# Patient Record
Sex: Female | Born: 1950 | Race: White | Hispanic: No | Marital: Married | Smoking: Never smoker
Health system: Southern US, Community
[De-identification: ages and names within clinical notes are randomized; demographics above are authoritative.]

## PROBLEM LIST (undated history)

## (undated) DIAGNOSIS — M810 Age-related osteoporosis without current pathological fracture: Secondary | ICD-10-CM

## (undated) DIAGNOSIS — I499 Cardiac arrhythmia, unspecified: Secondary | ICD-10-CM

## (undated) DIAGNOSIS — N200 Calculus of kidney: Secondary | ICD-10-CM

## (undated) DIAGNOSIS — C4490 Unspecified malignant neoplasm of skin, unspecified: Secondary | ICD-10-CM

## (undated) DIAGNOSIS — R7989 Other specified abnormal findings of blood chemistry: Secondary | ICD-10-CM

## (undated) DIAGNOSIS — E079 Disorder of thyroid, unspecified: Secondary | ICD-10-CM

## (undated) HISTORY — DX: Age-related osteoporosis without current pathological fracture: M81.0

## (undated) HISTORY — PX: EYE SURGERY: SHX253

## (undated) HISTORY — DX: Disorder of thyroid, unspecified: E07.9

## (undated) HISTORY — DX: Cardiac arrhythmia, unspecified: I49.9

## (undated) HISTORY — DX: Unspecified malignant neoplasm of skin, unspecified: C44.90

## (undated) HISTORY — PX: HYSTEROSCOPY: SHX211

## (undated) HISTORY — DX: Other specified abnormal findings of blood chemistry: R79.89

## (undated) HISTORY — PX: DILATION AND CURETTAGE OF UTERUS: SHX78

## (undated) HISTORY — DX: Calculus of kidney: N20.0

---

## 2000-06-12 ENCOUNTER — Other Ambulatory Visit: Admission: RE | Admit: 2000-06-12 | Discharge: 2000-06-12 | Payer: Self-pay | Admitting: Gynecology

## 2000-10-08 ENCOUNTER — Encounter: Admission: RE | Admit: 2000-10-08 | Discharge: 2000-10-08 | Payer: Self-pay | Admitting: Internal Medicine

## 2000-10-08 ENCOUNTER — Encounter: Payer: Self-pay | Admitting: Internal Medicine

## 2001-01-31 ENCOUNTER — Encounter: Admission: RE | Admit: 2001-01-31 | Discharge: 2001-01-31 | Payer: Self-pay | Admitting: Internal Medicine

## 2001-01-31 ENCOUNTER — Encounter: Payer: Self-pay | Admitting: Internal Medicine

## 2001-06-19 ENCOUNTER — Other Ambulatory Visit: Admission: RE | Admit: 2001-06-19 | Discharge: 2001-06-19 | Payer: Self-pay | Admitting: Gynecology

## 2002-12-09 ENCOUNTER — Other Ambulatory Visit: Admission: RE | Admit: 2002-12-09 | Discharge: 2002-12-09 | Payer: Self-pay | Admitting: Gynecology

## 2003-01-01 ENCOUNTER — Encounter: Admission: RE | Admit: 2003-01-01 | Discharge: 2003-01-01 | Payer: Self-pay | Admitting: Internal Medicine

## 2003-01-01 ENCOUNTER — Encounter: Payer: Self-pay | Admitting: Internal Medicine

## 2003-12-22 ENCOUNTER — Other Ambulatory Visit: Admission: RE | Admit: 2003-12-22 | Discharge: 2003-12-22 | Payer: Self-pay | Admitting: Gynecology

## 2004-10-13 ENCOUNTER — Encounter: Admission: RE | Admit: 2004-10-13 | Discharge: 2004-10-13 | Payer: Self-pay | Admitting: Internal Medicine

## 2004-12-15 ENCOUNTER — Other Ambulatory Visit: Admission: RE | Admit: 2004-12-15 | Discharge: 2004-12-15 | Payer: Self-pay | Admitting: Gynecology

## 2005-12-01 ENCOUNTER — Other Ambulatory Visit: Admission: RE | Admit: 2005-12-01 | Discharge: 2005-12-01 | Payer: Self-pay | Admitting: Gynecology

## 2006-12-24 ENCOUNTER — Other Ambulatory Visit: Admission: RE | Admit: 2006-12-24 | Discharge: 2006-12-24 | Payer: Self-pay | Admitting: Gynecology

## 2007-12-09 ENCOUNTER — Encounter: Admission: RE | Admit: 2007-12-09 | Discharge: 2007-12-09 | Payer: Self-pay | Admitting: Internal Medicine

## 2007-12-16 ENCOUNTER — Other Ambulatory Visit: Admission: RE | Admit: 2007-12-16 | Discharge: 2007-12-16 | Payer: Self-pay | Admitting: Gynecology

## 2009-09-08 ENCOUNTER — Encounter: Admission: RE | Admit: 2009-09-08 | Discharge: 2009-09-08 | Payer: Self-pay | Admitting: Internal Medicine

## 2010-01-06 ENCOUNTER — Encounter: Admission: RE | Admit: 2010-01-06 | Discharge: 2010-01-06 | Payer: Self-pay | Admitting: Internal Medicine

## 2010-05-02 ENCOUNTER — Encounter: Admission: RE | Admit: 2010-05-02 | Discharge: 2010-05-02 | Payer: Self-pay | Admitting: Gastroenterology

## 2010-10-05 ENCOUNTER — Other Ambulatory Visit: Admission: RE | Admit: 2010-10-05 | Discharge: 2010-10-05 | Payer: Self-pay | Admitting: Radiology

## 2011-01-15 ENCOUNTER — Encounter: Payer: Self-pay | Admitting: Gastroenterology

## 2011-07-24 ENCOUNTER — Other Ambulatory Visit: Payer: Self-pay | Admitting: Gynecology

## 2011-07-24 ENCOUNTER — Ambulatory Visit (HOSPITAL_BASED_OUTPATIENT_CLINIC_OR_DEPARTMENT_OTHER)
Admission: RE | Admit: 2011-07-24 | Discharge: 2011-07-24 | Disposition: A | Discharge status: Home / Self Care | Payer: PRIVATE HEALTH INSURANCE | Source: Ambulatory Visit | Attending: Gynecology | Admitting: Gynecology

## 2011-07-24 DIAGNOSIS — Z01812 Encounter for preprocedural laboratory examination: Secondary | ICD-10-CM | POA: Insufficient documentation

## 2011-07-24 DIAGNOSIS — N950 Postmenopausal bleeding: Secondary | ICD-10-CM | POA: Insufficient documentation

## 2011-07-24 DIAGNOSIS — N840 Polyp of corpus uteri: Secondary | ICD-10-CM | POA: Insufficient documentation

## 2011-07-24 LAB — POCT HEMOGLOBIN-HEMACUE: Hemoglobin: 12.6 g/dL (ref 12.0–15.0)

## 2011-08-03 NOTE — Op Note (Signed)
  NAME:  [name], Denise                   ACCOUNT NO.:  [id number]  MEDICAL RECORD NO.:  [id number]  LOCATION:                                 FACILITY:  PHYSICIAN:  Gretta Cool, M.D. DATE OF BIRTH:  1951-10-01  DATE OF PROCEDURE:  07/24/2011 DATE OF DISCHARGE:                              OPERATIVE REPORT   PREOPERATIVE DIAGNOSES: 1. Postmenopausal bleeding, persistent and recurrent. 2. Previous dilatation and curettage with disordered proliferative     endometrium.  POSTOPERATIVE DIAGNOSES: 1. Postmenopausal bleeding, persistent and recurrent. 2. Previous dilatation and curettage with disordered proliferative     endometrium.  PROCEDURES: 1. Hysteroscopy. 2. Dilatation and curettage, resection of polyps.  SURGEON:  Gretta Cool, MD  ANESTHESIA:  IV sedation and paracervical block.  DESCRIPTION OF PROCEDURE:  Under excellent anesthesia as above with the patient prepped and draped in Allen stirrups and her bladder drained, a weighted speculum was placed in the vagina and the cervix grasped with single-tooth tenaculum.  After the paracervical block was applied 4 quadrants, the cervix was progressively dilated with series of Pratt dilators to accommodate a 7-mm resectoscope.  The cavity was then photographed.  Quite lush polypoid looking endometrial tissue was present.  The polypoid areas were resected.  There was an endometrial tissue far out into the myometrium suggesting adenomyosis.  Once all of the gland containing endometrial tissue was resected, the bleeding was controlled with loop and cautery.  At the end of the procedure, with reduced pressure, there was no significant bleeding.  Note, there were several polyps in the endocervical canal that were resected also and submitted with this same specimen.  There were no complications.  No significant fluid deficit.  The patient returned to recovery room in excellent condition.     ______________________________ Gretta Cool, M.D.     CWL/MEDQ  D:  07/24/2011  T:  07/24/2011  Job:  [id number]  cc:   Antony Madura, M.D. Fax: [phone]  Electronically Signed by Beather Arbour M.D. on 08/03/2011 09:12:17 AM

## 2011-08-30 ENCOUNTER — Other Ambulatory Visit: Payer: Self-pay | Admitting: Internal Medicine

## 2011-08-30 DIAGNOSIS — R109 Unspecified abdominal pain: Secondary | ICD-10-CM

## 2011-08-31 ENCOUNTER — Ambulatory Visit
Admission: RE | Admit: 2011-08-31 | Discharge: 2011-08-31 | Disposition: A | Discharge status: Home / Self Care | Payer: PRIVATE HEALTH INSURANCE | Source: Ambulatory Visit | Attending: Internal Medicine | Admitting: Internal Medicine

## 2011-08-31 DIAGNOSIS — R109 Unspecified abdominal pain: Secondary | ICD-10-CM

## 2011-12-12 ENCOUNTER — Other Ambulatory Visit: Payer: Self-pay | Admitting: Gynecology

## 2012-12-12 ENCOUNTER — Other Ambulatory Visit: Payer: Self-pay | Admitting: Gynecology

## 2013-06-16 ENCOUNTER — Encounter: Payer: Self-pay | Admitting: Gynecology

## 2013-06-16 ENCOUNTER — Ambulatory Visit (INDEPENDENT_AMBULATORY_CARE_PROVIDER_SITE_OTHER): Payer: PRIVATE HEALTH INSURANCE | Admitting: Gynecology

## 2013-06-16 VITALS — BP 120/74 | Ht 60.0 in | Wt 118.0 lb

## 2013-06-16 DIAGNOSIS — R102 Pelvic and perineal pain: Secondary | ICD-10-CM

## 2013-06-16 DIAGNOSIS — M549 Dorsalgia, unspecified: Secondary | ICD-10-CM

## 2013-06-16 DIAGNOSIS — N949 Unspecified condition associated with female genital organs and menstrual cycle: Secondary | ICD-10-CM

## 2013-06-16 DIAGNOSIS — Z79890 Hormone replacement therapy (postmenopausal): Secondary | ICD-10-CM

## 2013-06-16 LAB — URINALYSIS W MICROSCOPIC + REFLEX CULTURE
Casts: NONE SEEN
Glucose, UA: NEGATIVE mg/dL
Ketones, ur: NEGATIVE mg/dL
Leukocytes, UA: NEGATIVE
RBC / HPF: NONE SEEN RBC/hpf (ref <3)
Specific Gravity, Urine: 1.02 (ref 1.005–1.030)
Urobilinogen, UA: 0.2 mg/dL (ref 0.0–1.0)

## 2013-06-16 NOTE — Progress Notes (Signed)
Denise [name] Sep 02, 1951 [id number]        62 y.o.  [id number] new patient, former patient of Dr. Nicholas Lose. Reports full exam in December 2013 to include Pap smear. She is on Vivelle-Dot unsure of the dose cutting them in half and Prometrium 200 mg first 12 days of the month every 3 months. She'll have occasional bleeding at the end of the 12 days but not consistently with each Prometrium treatment. Presents having started her Prometrium late this month patient several days ago noting premenstrual bloating and pelvic discomfort but no bleeding. No urinary frequency dysuria urgency. No constipation diarrhea or other GI symptoms.  Past medical history,surgical history, medications, allergies, family history and social history were all reviewed and documented in the EPIC chart.  ROS:  Performed and pertinent positives and negatives are included in the history, assessment and plan .  Exam: Kim assistant Filed Vitals:   06/16/13 1417  BP: 120/74  Height: 5' (1.524 m)  Weight: 118 lb (53.524 kg)   General appearance  Normal Skin grossly normal Abdominal  soft, nontender, without masses, organomegaly or hernia Pelvic  Ext/BUS/vagina  normal with atrophic changes  Cervix  normal with atrophic changes  Uterus  axial to anteverted, normal size, shape and contour, midline and mobile nontender   Adnexa  Without masses or tenderness    Anus and perineum  normal   Rectovaginal  normal sphincter tone without palpated masses or tenderness.    Assessment/Plan:  61 y.o. [id number] the patient on HRT using intermittent Prometrium withdrawal as noted above. Most recently with pelvic discomfort and bloating symptoms.  I reviewed the whole issue of HRT with her to include the WHI study with increased risk of stroke, heart attack, DVT and breast cancer. The ACOG and NAMS statements for lowest dose for the shortest period of time reviewed. Transdermal versus oral first-pass effect benefit discussed.  Options to include  continuing on the patch and switching to Prometrium 100 mg nightly versus weaning and stopping HRT altogether was discussed. Offbrand issues of cutting the patch in half with questionable absorption issues discussed. Recommend starting with ultrasound/sonohysterogram to assess for pelvic discomfort and endometrial echo assessment. Patient will schedule the ultrasound and then we'll go from there. At this point the patient wants to try weaning and stopping her HRT and we will see how she does with this. Urinalysis today was negative. Patient will followup her ultrasound and we'll go from there.    Dara Lords MD, 4:57 PM 06/16/2013

## 2013-06-16 NOTE — Patient Instructions (Signed)
Follow up for ultrasound as scheduled 

## 2013-06-20 ENCOUNTER — Ambulatory Visit: Payer: PRIVATE HEALTH INSURANCE | Admitting: Gynecology

## 2013-06-20 ENCOUNTER — Other Ambulatory Visit: Payer: PRIVATE HEALTH INSURANCE

## 2013-07-22 ENCOUNTER — Other Ambulatory Visit: Payer: Self-pay | Admitting: Gynecology

## 2013-07-22 DIAGNOSIS — Z79890 Hormone replacement therapy (postmenopausal): Secondary | ICD-10-CM

## 2013-07-22 DIAGNOSIS — R102 Pelvic and perineal pain: Secondary | ICD-10-CM

## 2013-07-22 DIAGNOSIS — N83339 Acquired atrophy of ovary and fallopian tube, unspecified side: Secondary | ICD-10-CM

## 2013-07-23 ENCOUNTER — Ambulatory Visit (INDEPENDENT_AMBULATORY_CARE_PROVIDER_SITE_OTHER): Payer: PRIVATE HEALTH INSURANCE

## 2013-07-23 ENCOUNTER — Ambulatory Visit (INDEPENDENT_AMBULATORY_CARE_PROVIDER_SITE_OTHER): Payer: PRIVATE HEALTH INSURANCE | Admitting: Gynecology

## 2013-07-23 ENCOUNTER — Encounter: Payer: Self-pay | Admitting: Gynecology

## 2013-07-23 DIAGNOSIS — N950 Postmenopausal bleeding: Secondary | ICD-10-CM

## 2013-07-23 DIAGNOSIS — R102 Pelvic and perineal pain unspecified side: Secondary | ICD-10-CM

## 2013-07-23 DIAGNOSIS — Z79890 Hormone replacement therapy (postmenopausal): Secondary | ICD-10-CM

## 2013-07-23 DIAGNOSIS — N83339 Acquired atrophy of ovary and fallopian tube, unspecified side: Secondary | ICD-10-CM

## 2013-07-23 DIAGNOSIS — N949 Unspecified condition associated with female genital organs and menstrual cycle: Secondary | ICD-10-CM

## 2013-07-23 DIAGNOSIS — N882 Stricture and stenosis of cervix uteri: Secondary | ICD-10-CM

## 2013-07-23 MED ORDER — LIDOCAINE HCL 1 % IJ SOLN
10.0000 mL | Freq: Once | INTRAMUSCULAR | Status: AC
  Administered 2013-07-23: 10 mL

## 2013-07-23 NOTE — Progress Notes (Signed)
Patient presents for sonohysterogram. History of HRT to include intermittent progesterone withdrawal. She would sometimes have a withdrawal bleed at the end of progesterone and other times not. This last time she did not withdraw but then had a spontaneous bleed about 2 weeks later.  Ultrasound shows uterus small. Endometrial echo 5.9 mm. Right and left ovaries atrophic. Cul-de-sac negative.  Sonohysterogram performed, sterile technique, paracervical block placed using 1% lidocaine 8 cc total due to cervical stenosis requiring mild dilatation for catheter placement, suboptimal distention but no abnormality seen within the cavity on pulsatile sterile saline ejection. Endometrial sample taken with scant return.  Assessment and plan: Postmenopausal bleeding. She has stopped her HRT and is doing well without significant hot flashes and night sweats. We'll followup her biopsy results. Assuming normal or inadequate then we will monitor. If she continues to have recurrent bleeding then she'll represent for further evaluation. Otherwise if she continues well off of HRT then will follow expectantly.

## 2013-07-23 NOTE — Patient Instructions (Signed)
Office will call you with the biopsy results 

## 2013-08-27 ENCOUNTER — Telehealth: Payer: Self-pay | Admitting: *Deleted

## 2013-08-27 NOTE — Telephone Encounter (Signed)
[name]

## 2013-08-27 NOTE — Telephone Encounter (Signed)
Pt informed with the below note. 

## 2013-08-27 NOTE — Telephone Encounter (Signed)
Pt called c/o hot flashes she stopped vivelle dot patches back in June. Pt is stilling having hot flashes, not terriable but uncomfortable. She asked if you know of any OTC medication she could try for hot flashes? Pt said she is nervous about using patches again because of the bleeding issues she was having. Any recommendations? Please advise

## 2013-10-08 ENCOUNTER — Other Ambulatory Visit: Payer: Self-pay | Admitting: Internal Medicine

## 2013-10-08 DIAGNOSIS — R109 Unspecified abdominal pain: Secondary | ICD-10-CM

## 2013-10-08 DIAGNOSIS — M549 Dorsalgia, unspecified: Secondary | ICD-10-CM

## 2013-10-08 DIAGNOSIS — R05 Cough: Secondary | ICD-10-CM

## 2013-10-09 ENCOUNTER — Ambulatory Visit
Admission: RE | Admit: 2013-10-09 | Discharge: 2013-10-09 | Disposition: A | Discharge status: Home / Self Care | Payer: PRIVATE HEALTH INSURANCE | Source: Ambulatory Visit | Attending: Internal Medicine | Admitting: Internal Medicine

## 2013-10-09 DIAGNOSIS — M549 Dorsalgia, unspecified: Secondary | ICD-10-CM

## 2013-10-09 DIAGNOSIS — R109 Unspecified abdominal pain: Secondary | ICD-10-CM

## 2013-10-09 DIAGNOSIS — R05 Cough: Secondary | ICD-10-CM

## 2013-11-24 DIAGNOSIS — M810 Age-related osteoporosis without current pathological fracture: Secondary | ICD-10-CM

## 2013-11-24 HISTORY — DX: Age-related osteoporosis without current pathological fracture: M81.0

## 2013-12-11 ENCOUNTER — Other Ambulatory Visit: Payer: Self-pay | Admitting: Internal Medicine

## 2013-12-11 ENCOUNTER — Ambulatory Visit
Admission: RE | Admit: 2013-12-11 | Discharge: 2013-12-11 | Disposition: A | Discharge status: Home / Self Care | Payer: PRIVATE HEALTH INSURANCE | Source: Ambulatory Visit | Attending: Internal Medicine | Admitting: Internal Medicine

## 2013-12-11 DIAGNOSIS — R05 Cough: Secondary | ICD-10-CM

## 2013-12-15 ENCOUNTER — Other Ambulatory Visit: Payer: Self-pay | Admitting: Internal Medicine

## 2013-12-15 DIAGNOSIS — R911 Solitary pulmonary nodule: Secondary | ICD-10-CM

## 2013-12-22 ENCOUNTER — Ambulatory Visit
Admission: RE | Admit: 2013-12-22 | Discharge: 2013-12-22 | Disposition: A | Discharge status: Home / Self Care | Payer: PRIVATE HEALTH INSURANCE | Source: Ambulatory Visit | Attending: Internal Medicine | Admitting: Internal Medicine

## 2013-12-22 DIAGNOSIS — R911 Solitary pulmonary nodule: Secondary | ICD-10-CM

## 2013-12-22 MED ORDER — IOHEXOL 300 MG/ML  SOLN
75.0000 mL | Freq: Once | INTRAMUSCULAR | Status: AC | PRN
  Administered 2013-12-22: 75 mL via INTRAVENOUS

## 2013-12-23 ENCOUNTER — Other Ambulatory Visit: Payer: Self-pay | Admitting: *Deleted

## 2013-12-23 DIAGNOSIS — Z0000 Encounter for general adult medical examination without abnormal findings: Secondary | ICD-10-CM

## 2013-12-24 ENCOUNTER — Encounter: Payer: Self-pay | Admitting: Gynecology

## 2013-12-30 ENCOUNTER — Telehealth: Payer: Self-pay | Admitting: Gynecology

## 2013-12-30 NOTE — Telephone Encounter (Signed)
Asked patient when she took Fosamax, how long and when she stopped. Her bone density shows osteoporosis but actually looks improved from her prior density in the question is whether we need to treat her or keep observing her.

## 2013-12-31 ENCOUNTER — Encounter: Payer: Self-pay | Admitting: Gynecology

## 2013-12-31 NOTE — Telephone Encounter (Signed)
Tell patient her most recent bone density showed stability and given the other information I would recommend observation with repeat density in 2 years. She can also discuss with Dr. Su Hiltoberts who actually is the ordering physician

## 2013-12-31 NOTE — Telephone Encounter (Signed)
Pt said she took fosamax on & off for about 5 years, has been off fosamax for a year and half now. She takes Vit.D 2,000 units daily

## 2014-01-01 NOTE — Telephone Encounter (Signed)
Pt informed with the below. 

## 2014-01-16 ENCOUNTER — Encounter: Payer: Self-pay | Admitting: Internal Medicine

## 2014-01-22 ENCOUNTER — Ambulatory Visit (INDEPENDENT_AMBULATORY_CARE_PROVIDER_SITE_OTHER): Payer: PRIVATE HEALTH INSURANCE | Admitting: Gynecology

## 2014-01-22 ENCOUNTER — Encounter: Payer: Self-pay | Admitting: Gynecology

## 2014-01-22 VITALS — BP 112/66 | Ht 60.0 in | Wt 120.0 lb

## 2014-01-22 DIAGNOSIS — E079 Disorder of thyroid, unspecified: Secondary | ICD-10-CM | POA: Insufficient documentation

## 2014-01-22 DIAGNOSIS — N951 Menopausal and female climacteric states: Secondary | ICD-10-CM

## 2014-01-22 DIAGNOSIS — Z01419 Encounter for gynecological examination (general) (routine) without abnormal findings: Secondary | ICD-10-CM

## 2014-01-22 DIAGNOSIS — M810 Age-related osteoporosis without current pathological fracture: Secondary | ICD-10-CM

## 2014-01-22 NOTE — Patient Instructions (Signed)
Followup in one year for annual exam. Sooner if any issues. Report any vaginal bleeding. If hot flashes become significant then we can rediscuss treatment options.

## 2014-01-22 NOTE — Progress Notes (Signed)
Denise BeckersJudy C Sease 04-30-1951 [id number]        63 y.o.  G2P2002 for annual exam.  Several issues noted below.  Past medical history,surgical history, problem list, medications, allergies, family history and social history were all reviewed and documented in the EPIC chart.  ROS:  Performed and pertinent positives and negatives are included in the history, assessment and plan .  Exam: Kim assistant Filed Vitals:   01/22/14 0947  BP: 112/66  Height: 5' (1.524 m)  Weight: 120 lb (54.432 kg)   General appearance  Normal Skin grossly normal Head/Neck normal with no cervical or supraclavicular adenopathy thyroid normal Lungs  clear Cardiac RR, without RMG Abdominal  soft, nontender, without masses, organomegaly or hernia Breasts  examined lying and sitting without masses, retractions, discharge or axillary adenopathy. Pelvic  Ext/BUS/vagina  Normal with mild atrophic changes  Cervix  Normal with mild atrophic changes  Uterus  anteverted, normal size, shape and contour, midline and mobile nontender   Adnexa  Without masses or tenderness    Anus and perineum  Normal   Rectovaginal  Normal sphincter tone without palpated masses or tenderness.    Assessment/Plan:  63 y.o. [id number] female for annual exam.   1. Postmenopausal/menopausal symptoms. Patient had been on HRT previously but discontinued. Had a negative workup for postmenopausal bleeding this past summer with no further bleeding. Is having hot flashes throughout the day. Is on extra soy. Options for management to include OTC products, Effexor, reinitiation of HRT discussed. At this point the patient refers just monitoring. Otherwise doing well without significant vaginal dryness or dyspareunia. Patient knows to report any vaginal bleeding. 2. Osteoporosis. DEXA 11/2013 with T score -2.5. Reports being on Fosamax for approximately 4 years discontinued 2 years ago. DEXA stable from prior study. We'll continue to monitor with repeat DEXA at  two-year interval. Increase calcium vitamin D reviewed. Check vitamin D level today. 3. Mammography 11/2013. Continue with annual mammography. SBE monthly reviewed. 4. Pap smear 11/2012. No Pap smear done today. No history of significant abnormal Pap smears. Repeat Pap smear in [address]-year-old interval. 5. Colonoscopy 3 years ago. Repeat at their recommended interval. 6. Health maintenance. No other lab work other than vitamin D done. Patient reports this all done through her primary physician's office. Followup one year, sooner as needed.   Note: This document was prepared with digital dictation and possible smart phrase technology. Any transcriptional errors that result from this process are unintentional.   Dara LordsFONTAINE,Allure Greaser P MD, 10:25 AM 01/22/2014

## 2014-01-23 LAB — URINALYSIS W MICROSCOPIC + REFLEX CULTURE
BILIRUBIN URINE: NEGATIVE
CASTS: NONE SEEN
GLUCOSE, UA: NEGATIVE mg/dL
Hgb urine dipstick: NEGATIVE
Ketones, ur: NEGATIVE mg/dL
LEUKOCYTES UA: NEGATIVE
Nitrite: NEGATIVE
PH: 6.5 (ref 5.0–8.0)
Protein, ur: NEGATIVE mg/dL
SPECIFIC GRAVITY, URINE: 1.006 (ref 1.005–1.030)
SQUAMOUS EPITHELIAL / LPF: NONE SEEN
Urobilinogen, UA: 0.2 mg/dL (ref 0.0–1.0)

## 2014-01-23 LAB — VITAMIN D 25 HYDROXY (VIT D DEFICIENCY, FRACTURES): VIT D 25 HYDROXY: 48 ng/mL (ref 30–89)

## 2014-01-24 LAB — URINE CULTURE

## 2014-09-24 ENCOUNTER — Other Ambulatory Visit: Payer: Self-pay | Admitting: Internal Medicine

## 2014-09-24 DIAGNOSIS — R1010 Upper abdominal pain, unspecified: Secondary | ICD-10-CM

## 2014-09-29 ENCOUNTER — Other Ambulatory Visit: Payer: Self-pay | Admitting: Internal Medicine

## 2014-09-29 ENCOUNTER — Encounter (INDEPENDENT_AMBULATORY_CARE_PROVIDER_SITE_OTHER): Payer: Self-pay

## 2014-09-29 ENCOUNTER — Ambulatory Visit
Admission: RE | Admit: 2014-09-29 | Discharge: 2014-09-29 | Disposition: A | Discharge status: Home / Self Care | Payer: PRIVATE HEALTH INSURANCE | Source: Ambulatory Visit | Attending: Internal Medicine | Admitting: Internal Medicine

## 2014-09-29 DIAGNOSIS — R1010 Upper abdominal pain, unspecified: Secondary | ICD-10-CM

## 2014-10-26 ENCOUNTER — Encounter: Payer: Self-pay | Admitting: Gynecology

## 2014-12-30 ENCOUNTER — Encounter: Payer: Self-pay | Admitting: Gynecology

## 2015-01-28 ENCOUNTER — Ambulatory Visit (INDEPENDENT_AMBULATORY_CARE_PROVIDER_SITE_OTHER): Payer: 59 | Admitting: Gynecology

## 2015-01-28 ENCOUNTER — Encounter: Payer: Self-pay | Admitting: Gynecology

## 2015-01-28 VITALS — BP 120/76 | Ht 62.0 in | Wt 123.0 lb

## 2015-01-28 DIAGNOSIS — Z01419 Encounter for gynecological examination (general) (routine) without abnormal findings: Secondary | ICD-10-CM

## 2015-01-28 DIAGNOSIS — M810 Age-related osteoporosis without current pathological fracture: Secondary | ICD-10-CM

## 2015-01-28 DIAGNOSIS — N952 Postmenopausal atrophic vaginitis: Secondary | ICD-10-CM

## 2015-01-28 NOTE — Patient Instructions (Signed)
You may obtain a copy of any labs that were done today by logging onto MyChart as outlined in the instructions provided with your AVS (after visit summary). The office will not call with normal lab results but certainly if there are any significant abnormalities then we will contact you.   Health Maintenance, Female A healthy lifestyle and preventative care can promote health and wellness.  Maintain regular health, dental, and eye exams.  Eat a healthy diet. Foods like vegetables, fruits, whole grains, low-fat dairy products, and lean protein foods contain the nutrients you need without too many calories. Decrease your intake of foods high in solid fats, added sugars, and salt. Get information about a proper diet from your caregiver, if necessary.  Regular physical exercise is one of the most important things you can do for your health. Most adults should get at least 150 minutes of moderate-intensity exercise (any activity that increases your heart rate and causes you to sweat) each week. In addition, most adults need muscle-strengthening exercises on 2 or more days a week.   Maintain a healthy weight. The body mass index (BMI) is a screening tool to identify possible weight problems. It provides an estimate of body fat based on height and weight. Your caregiver can help determine your BMI, and can help you achieve or maintain a healthy weight. For adults 20 years and older:  A BMI below 18.5 is considered underweight.  A BMI of 18.5 to 24.9 is normal.  A BMI of 25 to 29.9 is considered overweight.  A BMI of 30 and above is considered obese.  Maintain normal blood lipids and cholesterol by exercising and minimizing your intake of saturated fat. Eat a balanced diet with plenty of fruits and vegetables. Blood tests for lipids and cholesterol should begin at age 61 and be repeated every 5 years. If your lipid or cholesterol levels are high, you are over 50, or you are a high risk for heart  disease, you may need your cholesterol levels checked more frequently.Ongoing high lipid and cholesterol levels should be treated with medicines if diet and exercise are not effective.  If you smoke, find out from your caregiver how to quit. If you do not use tobacco, do not start.  Lung cancer screening is recommended for adults aged 33 80 years who are at high risk for developing lung cancer because of a history of smoking. Yearly low-dose computed tomography (CT) is recommended for people who have at least a 30-pack-year history of smoking and are a current smoker or have quit within the past 15 years. A pack year of smoking is smoking an average of 1 pack of cigarettes a day for 1 year (for example: 1 pack a day for 30 years or 2 packs a day for 15 years). Yearly screening should continue until the smoker has stopped smoking for at least 15 years. Yearly screening should also be stopped for people who develop a health problem that would prevent them from having lung cancer treatment.  If you are pregnant, do not drink alcohol. If you are breastfeeding, be very cautious about drinking alcohol. If you are not pregnant and choose to drink alcohol, do not exceed 1 drink per day. One drink is considered to be 12 ounces (355 mL) of beer, 5 ounces (148 mL) of wine, or 1.5 ounces (44 mL) of liquor.  Avoid use of street drugs. Do not share needles with anyone. Ask for help if you need support or instructions about stopping  the use of drugs.  High blood pressure causes heart disease and increases the risk of stroke. Blood pressure should be checked at least every 1 to 2 years. Ongoing high blood pressure should be treated with medicines, if weight loss and exercise are not effective.  If you are 59 to 62 years old, ask your caregiver if you should take aspirin to prevent strokes.  Diabetes screening involves taking a blood sample to check your fasting blood sugar level. This should be done once every 3  years, after age 91, if you are within normal weight and without risk factors for diabetes. Testing should be considered at a younger age or be carried out more frequently if you are overweight and have at least 1 risk factor for diabetes.  Breast cancer screening is essential preventative care for women. You should practice "breast self-awareness." This means understanding the normal appearance and feel of your breasts and may include breast self-examination. Any changes detected, no matter how small, should be reported to a caregiver. Women in their 66s and 30s should have a clinical breast exam (CBE) by a caregiver as part of a regular health exam every 1 to 3 years. After age 101, women should have a CBE every year. Starting at age 100, women should consider having a mammogram (breast X-ray) every year. Women who have a family history of breast cancer should talk to their caregiver about genetic screening. Women at a high risk of breast cancer should talk to their caregiver about having an MRI and a mammogram every year.  Breast cancer gene (BRCA)-related cancer risk assessment is recommended for women who have family members with BRCA-related cancers. BRCA-related cancers include breast, ovarian, tubal, and peritoneal cancers. Having family members with these cancers may be associated with an increased risk for harmful changes (mutations) in the breast cancer genes BRCA1 and BRCA2. Results of the assessment will determine the need for genetic counseling and BRCA1 and BRCA2 testing.  The Pap test is a screening test for cervical cancer. Women should have a Pap test starting at age 57. Between ages 25 and 35, Pap tests should be repeated every 2 years. Beginning at age 37, you should have a Pap test every 3 years as long as the past 3 Pap tests have been normal. If you had a hysterectomy for a problem that was not cancer or a condition that could lead to cancer, then you no longer need Pap tests. If you are  between ages 50 and 76, and you have had normal Pap tests going back 10 years, you no longer need Pap tests. If you have had past treatment for cervical cancer or a condition that could lead to cancer, you need Pap tests and screening for cancer for at least 20 years after your treatment. If Pap tests have been discontinued, risk factors (such as a new sexual partner) need to be reassessed to determine if screening should be resumed. Some women have medical problems that increase the chance of getting cervical cancer. In these cases, your caregiver may recommend more frequent screening and Pap tests.  The human papillomavirus (HPV) test is an additional test that may be used for cervical cancer screening. The HPV test looks for the virus that can cause the cell changes on the cervix. The cells collected during the Pap test can be tested for HPV. The HPV test could be used to screen women aged 44 years and older, and should be used in women of any age  who have unclear Pap test results. After the age of 55, women should have HPV testing at the same frequency as a Pap test.  Colorectal cancer can be detected and often prevented. Most routine colorectal cancer screening begins at the age of 44 and continues through age 20. However, your caregiver may recommend screening at an earlier age if you have risk factors for colon cancer. On a yearly basis, your caregiver may provide home test kits to check for hidden blood in the stool. Use of a small camera at the end of a tube, to directly examine the colon (sigmoidoscopy or colonoscopy), can detect the earliest forms of colorectal cancer. Talk to your caregiver about this at age 86, when routine screening begins. Direct examination of the colon should be repeated every 5 to 10 years through age 13, unless early forms of pre-cancerous polyps or small growths are found.  Hepatitis C blood testing is recommended for all people born from [birth year] through 1963 and any  individual with known risks for hepatitis C.  Practice safe sex. Use condoms and avoid high-risk sexual practices to reduce the spread of sexually transmitted infections (STIs). Sexually active women aged 36 and younger should be checked for Chlamydia, which is a common sexually transmitted infection. Older women with new or multiple partners should also be tested for Chlamydia. Testing for other STIs is recommended if you are sexually active and at increased risk.  Osteoporosis is a disease in which the bones lose minerals and strength with aging. This can result in serious bone fractures. The risk of osteoporosis can be identified using a bone density scan. Women ages 20 and over and women at risk for fractures or osteoporosis should discuss screening with their caregivers. Ask your caregiver whether you should be taking a calcium supplement or vitamin D to reduce the rate of osteoporosis.  Menopause can be associated with physical symptoms and risks. Hormone replacement therapy is available to decrease symptoms and risks. You should talk to your caregiver about whether hormone replacement therapy is right for you.  Use sunscreen. Apply sunscreen liberally and repeatedly throughout the day. You should seek shade when your shadow is shorter than you. Protect yourself by wearing long sleeves, pants, a wide-brimmed hat, and sunglasses year round, whenever you are outdoors.  Notify your caregiver of new moles or changes in moles, especially if there is a change in shape or color. Also notify your caregiver if a mole is larger than the size of a pencil eraser.  Stay current with your immunizations. Document Released: 06/26/2011 Document Revised: 04/07/2013 Document Reviewed: 06/26/2011 [facility] Patient Information 2014 [employer].

## 2015-01-28 NOTE — Progress Notes (Signed)
Denise BeckersJudy C [name] 1951-05-12 [id number]        64 y.o.  G2P2002 for annual exam.  Several issues noted below.  Past medical history,surgical history, problem list, medications, allergies, family history and social history were all reviewed and documented as reviewed in the EPIC chart.  ROS:  Performed with pertinent positives and negatives included in the history, assessment and plan.   Additional significant findings :  none   Exam: Denise [name] Filed Vitals:   01/28/15 0919  BP: 120/76  Height: 5\' 2"  (1.575 m)  Weight: 123 lb (55.792 kg)   General appearance:  Normal affect, orientation and appearance. Skin: Grossly normal HEENT: Without gross lesions.  No cervical or supraclavicular adenopathy. Thyroid normal.  Lungs:  Clear without wheezing, rales or rhonchi Cardiac: RR, without RMG Abdominal:  Soft, nontender, without masses, guarding, rebound, organomegaly or hernia Breasts:  Examined lying and sitting without masses, retractions, discharge or axillary adenopathy. Pelvic:  Ext/BUS/vagina with generalized atrophic changes  Cervix with atrophic changes  Uterus anteverted, normal size, shape and contour, midline and mobile nontender   Adnexa  Without masses or tenderness    Anus and perineum  Normal   Rectovaginal  Normal sphincter tone without palpated masses or tenderness.    Assessment/Plan:  64 y.o. 662P2002 female for annual exam.   1. Postmenopausal/atrophic genital changes. Patient still having some hot flashes and night sweats.  Also noting some vaginal dryness. No significant dyspareunia. No vaginal bleeding. Options again reviewed up to and including HRT and she is not interested in intervention at this time. Will call if any vaginal bleeding or desires to rediscuss treatment options. 2. Osteoporosis. DEXA 11/2013 T score -2.5 with statistically significant increase in bone density at all sites from prior DEXA.  History of Fosamax for approximately 4 years, discontinued 3  years ago. Plan repeat DEXA next year at two-year interval. Increased calcium vitamin D reviewed. Patient notes having recent vitamin D level normal at her primary physician's office. 3. Mammography 12/2014. Continue with annual mammography. SBE monthly reviewed. 4. Colonoscopy 4 years ago. Repeat at their recommended interval. 5. Health maintenance. No routine blood work done as she reports this done at her primary physician's office. Follow up in one year, sooner as needed.     Dara LordsFONTAINE,Denise [name], 9:54 AM 01/28/2015

## 2015-01-29 LAB — URINALYSIS W MICROSCOPIC + REFLEX CULTURE
BACTERIA UA: NONE SEEN
Bilirubin Urine: NEGATIVE
Casts: NONE SEEN
Glucose, UA: NEGATIVE mg/dL
HGB URINE DIPSTICK: NEGATIVE
KETONES UR: NEGATIVE mg/dL
Leukocytes, UA: NEGATIVE
Nitrite: NEGATIVE
Protein, ur: NEGATIVE mg/dL
Specific Gravity, Urine: 1.02 (ref 1.005–1.030)
Urobilinogen, UA: 0.2 mg/dL (ref 0.0–1.0)
pH: 6.5 (ref 5.0–8.0)

## 2015-04-29 ENCOUNTER — Other Ambulatory Visit: Payer: Self-pay | Admitting: Gastroenterology

## 2015-04-29 DIAGNOSIS — R1084 Generalized abdominal pain: Secondary | ICD-10-CM

## 2015-05-06 ENCOUNTER — Ambulatory Visit
Admission: RE | Admit: 2015-05-06 | Discharge: 2015-05-06 | Disposition: A | Discharge status: Home / Self Care | Payer: PRIVATE HEALTH INSURANCE | Source: Ambulatory Visit | Attending: Gastroenterology | Admitting: Gastroenterology

## 2015-05-06 DIAGNOSIS — R1084 Generalized abdominal pain: Secondary | ICD-10-CM

## 2016-02-17 ENCOUNTER — Encounter: Payer: Self-pay | Admitting: Gynecology

## 2016-03-22 ENCOUNTER — Ambulatory Visit (INDEPENDENT_AMBULATORY_CARE_PROVIDER_SITE_OTHER): Payer: 59 | Admitting: Gynecology

## 2016-03-22 ENCOUNTER — Encounter: Payer: Self-pay | Admitting: Gynecology

## 2016-03-22 VITALS — BP 118/74 | Ht 61.0 in | Wt 121.0 lb

## 2016-03-22 DIAGNOSIS — M810 Age-related osteoporosis without current pathological fracture: Secondary | ICD-10-CM

## 2016-03-22 DIAGNOSIS — N952 Postmenopausal atrophic vaginitis: Secondary | ICD-10-CM

## 2016-03-22 DIAGNOSIS — Z01419 Encounter for gynecological examination (general) (routine) without abnormal findings: Secondary | ICD-10-CM

## 2016-03-22 NOTE — Addendum Note (Signed)
Addended by: Dayna BarkerGARDNER, Giannie Soliday K on: 03/22/2016 11:07 AM   Modules accepted: Orders

## 2016-03-22 NOTE — Patient Instructions (Signed)
Followup for bone density as scheduled. 

## 2016-03-22 NOTE — Progress Notes (Signed)
    Denise [name] 11/26/1951 [id number]        65 y.o.  G2P2002  for annual exam.  Several issues noted below.  Past medical history,surgical history, problem list, medications, allergies, family history and social history were all reviewed and documented as reviewed in the EPIC chart.  ROS:  Performed with pertinent positives and negatives included in the history, assessment and plan.   Additional significant findings :  none   Exam: Denise [name] assistant Filed Vitals:   03/22/16 1026  BP: 118/74  Height: 5\' 1"  (1.549 m)  Weight: 121 lb (54.885 kg)   General appearance:  Normal affect, orientation and appearance. Skin: Grossly normal HEENT: Without gross lesions.  No cervical or supraclavicular adenopathy. Thyroid normal.  Lungs:  Clear without wheezing, rales or rhonchi Cardiac: RR, without RMG Abdominal:  Soft, nontender, without masses, guarding, rebound, organomegaly or hernia Breasts:  Examined lying and sitting without masses, retractions, discharge or axillary adenopathy. Pelvic:  Ext/BUS/vagina with atrophic changes  Cervix with atrophic changes. Pap smear done  Uterus anteverted, normal size, shape and contour, midline and mobile nontender   Adnexa without masses or tenderness    Anus and perineum normal   Rectovaginal normal sphincter tone without palpated masses or tenderness.    Assessment/Plan:  65 y.o. 532P2002 female for annual exam.   1. Postmenopausal/atrophic genital changes. Without significant hot flashes, night sweats, vaginal dryness or any vaginal bleeding. Continue to monitor and report any issues or vaginal bleeding. 2. Osteoporosis. DEXA 11/2013 T score -2.5. Statistically significantly increased at all sites from prior DEXA. History of Fosamax for approximately 4 years discontinued 4 years ago. Check baseline DEXA now and patient will schedule. Is on extra vitamin D and being monitored by her primary physician. 3. Mammography 01/2016. Continue with  annual mammography when due. SBE monthly reviewed. 4. Colonoscopy 2012. Repeat at their recommended interval. 5. Pap smear 11/2012. Pap smear done today. No history of significant abnormal Pap smears previously. 6. Health maintenance. No routine lab work done as patient reports this done at her primary physician's office. Follow up 1 year, sooner as needed.   Denise LordsFONTAINE,Jearldean [name] P MD, 10:46 AM 03/22/2016

## 2016-03-23 LAB — PAP IG W/ RFLX HPV ASCU

## 2016-04-04 ENCOUNTER — Telehealth: Payer: Self-pay | Admitting: Gynecology

## 2016-04-04 ENCOUNTER — Ambulatory Visit (INDEPENDENT_AMBULATORY_CARE_PROVIDER_SITE_OTHER): Payer: 59

## 2016-04-04 DIAGNOSIS — M810 Age-related osteoporosis without current pathological fracture: Secondary | ICD-10-CM | POA: Diagnosis not present

## 2016-04-04 NOTE — Telephone Encounter (Signed)
Pt informed with the below note, order placed, pt coming on 04/06/16 @ 3:30pm

## 2016-04-04 NOTE — Telephone Encounter (Signed)
Tell patient that her most recent bone density looks worse than her prior one and she is in the osteoporosis range. Recommend office visit to discuss treatment options. Would also like her to have a vitamin D level drawn before the visit so we have these results back.

## 2016-04-06 ENCOUNTER — Other Ambulatory Visit: Payer: 59

## 2016-04-06 DIAGNOSIS — M810 Age-related osteoporosis without current pathological fracture: Secondary | ICD-10-CM

## 2016-04-07 LAB — VITAMIN D 25 HYDROXY (VIT D DEFICIENCY, FRACTURES): VIT D 25 HYDROXY: 30 ng/mL (ref 30–100)

## 2016-04-13 ENCOUNTER — Ambulatory Visit: Payer: 59 | Admitting: Gynecology

## 2016-04-14 ENCOUNTER — Ambulatory Visit (INDEPENDENT_AMBULATORY_CARE_PROVIDER_SITE_OTHER): Payer: 59 | Admitting: Gynecology

## 2016-04-14 ENCOUNTER — Encounter: Payer: Self-pay | Admitting: Gynecology

## 2016-04-14 VITALS — BP 120/76

## 2016-04-14 DIAGNOSIS — M810 Age-related osteoporosis without current pathological fracture: Secondary | ICD-10-CM

## 2016-04-14 LAB — COMPREHENSIVE METABOLIC PANEL
ALK PHOS: 85 U/L (ref 33–130)
ALT: 22 U/L (ref 6–29)
AST: 22 U/L (ref 10–35)
Albumin: 4 g/dL (ref 3.6–5.1)
BILIRUBIN TOTAL: 0.5 mg/dL (ref 0.2–1.2)
BUN: 13 mg/dL (ref 7–25)
CALCIUM: 8.8 mg/dL (ref 8.6–10.4)
CO2: 24 mmol/L (ref 20–31)
CREATININE: 0.7 mg/dL (ref 0.50–0.99)
Chloride: 107 mmol/L (ref 98–110)
GLUCOSE: 95 mg/dL (ref 65–99)
Potassium: 3.8 mmol/L (ref 3.5–5.3)
Sodium: 140 mmol/L (ref 135–146)
Total Protein: 6.7 g/dL (ref 6.1–8.1)

## 2016-04-14 LAB — TSH: TSH: 0.6 mIU/L

## 2016-04-14 MED ORDER — ALENDRONATE SODIUM 70 MG PO TABS
70.0000 mg | ORAL_TABLET | ORAL | Status: DC
Start: 2016-04-14 — End: 2017-04-24

## 2016-04-14 NOTE — Progress Notes (Signed)
    Denise BeckersJudy C [name] 1951-11-28 [id number]        65 y.o.  [id number] Presents to discuss her most recent bone density. T score -3.4 [facility] spine with previous 2014 study done at different facility with T score -2.5. Also in the right femoral neck T score -2.5 with 2014 value -2.1  Had been on Fosamax for approximately 4 years discontinued 4-5 years ago.  Past medical history,surgical history, problem list, medications, allergies, family history and social history were all reviewed and documented in the EPIC chart.  Directed ROS with pertinent positives and negatives documented in the history of present illness/assessment and plan.  Exam: Filed Vitals:   04/14/16 1031  BP: 120/76   General appearance:  Normal   Assessment/Plan:  65 y.o. [id number] with osteoporosis and apparent decline from prior study. Prior use of Fosamax for 4 years. Given her younger age we'll go ahead and evaluate for secondary causes to include TSH PTH recent vitamin D level 30. Had been on vitamin D but she stopped taking it. Recommended that she start back on 2000 units daily. Also discussed treatment options to include bisphosphonates and Prolia. Since she did well on Fosamax previously will go ahead and reinitiate 70 mg weekly with planned repeat DEXA in 2 years. Risks of treatment reviewed to include GERD, osteonecrosis of jaw atypical fractures. Patient is comfortable starting and she'll follow up with her 24-hour urine collection and her lab work.    Dara LordsFONTAINE,Jheri Mitter P MD, 10:47 AM 04/14/2016

## 2016-04-14 NOTE — Patient Instructions (Signed)
Office will call you with your blood test results. Return the 24 hour urine collection Start on the Fosamax one pill weekly.  Alendronate tablets What is this medicine? ALENDRONATE (a LEN droe nate) slows calcium loss from bones. It helps to make normal healthy bone and to slow bone loss in people with Paget's disease and osteoporosis. It may be used in others at risk for bone loss. This medicine may be used for other purposes; ask your health care provider or pharmacist if you have questions. What should I tell my health care provider before I take this medicine? They need to know if you have any of these conditions: -dental disease -esophagus, stomach, or intestine problems, like acid reflux or GERD -kidney disease -low blood calcium -low vitamin D -problems sitting or standing 30 minutes -trouble swallowing -an unusual or allergic reaction to alendronate, other medicines, foods, dyes, or preservatives -pregnant or trying to get pregnant -breast-feeding How should I use this medicine? You must take this medicine exactly as directed or you will lower the amount of the medicine you absorb into your body or you may cause yourself harm. Take this medicine by mouth first thing in the morning, after you are up for the day. Do not eat or drink anything before you take your medicine. Swallow the tablet with a full glass (6 to 8 fluid ounces) of plain water. Do not take this medicine with any other drink. Do not chew or crush the tablet. After taking this medicine, do not eat breakfast, drink, or take any medicines or vitamins for at least 30 minutes. Sit or stand up for at least 30 minutes after you take this medicine; do not lie down. Do not take your medicine more often than directed. Talk to your pediatrician regarding the use of this medicine in children. Special care may be needed. Overdosage: If you think you have taken too much of this medicine contact a poison control center or emergency  room at once. NOTE: This medicine is only for you. Do not share this medicine with others. What if I miss a dose? If you miss a dose, do not take it later in the day. Continue your normal schedule starting the next morning. Do not take double or extra doses. What may interact with this medicine? -aluminum hydroxide -antacids -aspirin -calcium supplements -drugs for inflammation like ibuprofen, naproxen, and others -iron supplements -magnesium supplements -vitamins with minerals This list may not describe all possible interactions. Give your health care provider a list of all the medicines, herbs, non-prescription drugs, or dietary supplements you use. Also tell them if you smoke, drink alcohol, or use illegal drugs. Some items may interact with your medicine. What should I watch for while using this medicine? Visit your doctor or health care professional for regular checks ups. It may be some time before you see benefit from this medicine. Do not stop taking your medicine except on your doctor's advice. Your doctor or health care professional may order blood tests and other tests to see how you are doing. You should make sure you get enough calcium and vitamin D while you are taking this medicine, unless your doctor tells you not to. Discuss the foods you eat and the vitamins you take with your health care professional. Some people who take this medicine have severe bone, joint, and/or muscle pain. This medicine may also increase your risk for a broken thigh bone. Tell your doctor right away if you have pain in your upper leg  or groin. Tell your doctor if you have any pain that does not go away or that gets worse. This medicine can make you more sensitive to the sun. If you get a rash while taking this medicine, sunlight may cause the rash to get worse. Keep out of the sun. If you cannot avoid being in the sun, wear protective clothing and use sunscreen. Do not use sun lamps or tanning  beds/booths. What side effects may I notice from receiving this medicine? Side effects that you should report to your doctor or health care professional as soon as possible: -allergic reactions like skin rash, itching or hives, swelling of the face, lips, or tongue -black or tarry stools -bone, muscle or joint pain -changes in vision -chest pain -heartburn or stomach pain -jaw pain, especially after dental work -pain or trouble when swallowing -redness, blistering, peeling or loosening of the skin, including inside the mouth Side effects that usually do not require medical attention (report to your doctor or health care professional if they continue or are bothersome): -changes in taste -diarrhea or constipation -eye pain or itching -headache -nausea or vomiting -stomach gas or fullness This list may not describe all possible side effects. Call your doctor for medical advice about side effects. You may report side effects to FDA at 1-800-FDA-1088. Where should I keep my medicine? Keep out of the reach of children. Store at room temperature of 15 and 30 degrees C (59 and 86 degrees F). Throw away any unused medicine after the expiration date. NOTE: This sheet is a summary. It may not cover all possible information. If you have questions about this medicine, talk to your doctor, pharmacist, or health care provider.    2016, [employer]. (2011-06-09 08:56:09)

## 2016-04-15 LAB — URINALYSIS W MICROSCOPIC + REFLEX CULTURE
BACTERIA UA: NONE SEEN [HPF]
BILIRUBIN URINE: NEGATIVE
CASTS: NONE SEEN [LPF]
CRYSTALS: NONE SEEN [HPF]
Glucose, UA: NEGATIVE
Ketones, ur: NEGATIVE
Nitrite: NEGATIVE
PROTEIN: NEGATIVE
Specific Gravity, Urine: 1.01 (ref 1.001–1.035)
WBC UA: NONE SEEN WBC/HPF (ref <=5)
Yeast: NONE SEEN [HPF]
pH: 6.5 (ref 5.0–8.0)

## 2016-04-17 ENCOUNTER — Other Ambulatory Visit: Payer: Self-pay | Admitting: Gynecology

## 2016-04-17 DIAGNOSIS — R3129 Other microscopic hematuria: Secondary | ICD-10-CM

## 2016-04-17 LAB — URINE CULTURE: Colony Count: >=100000

## 2016-04-17 LAB — PARATHYROID HORMONE, INTACT (NO CA): PTH: 28 pg/mL (ref 14–64)

## 2016-04-17 MED ORDER — SULFAMETHOXAZOLE-TRIMETHOPRIM 800-160 MG PO TABS: 1.0000 | ORAL_TABLET | Freq: Two times a day (BID) | ORAL | Status: DC

## 2016-05-02 ENCOUNTER — Telehealth: Payer: Self-pay | Admitting: *Deleted

## 2016-05-02 NOTE — Telephone Encounter (Signed)
Pt called c/o that septra ds prescribed on 04/17/16 didn't seem to help much, pt still had pressure, she had Cipro 250 mg left over and took a couple tablet of those. I explained to pt that she was to return to office for repeat u/a due to blood in urine. Pt forgot about this and said she will come tomorrow to repeat.  Will inform front desk for lab appt.

## 2016-05-03 ENCOUNTER — Other Ambulatory Visit: Payer: 59

## 2016-05-03 DIAGNOSIS — R3129 Other microscopic hematuria: Secondary | ICD-10-CM

## 2016-05-04 LAB — URINALYSIS W MICROSCOPIC + REFLEX CULTURE
Bacteria, UA: NONE SEEN [HPF]
Bilirubin Urine: NEGATIVE
Casts: NONE SEEN [LPF]
Crystals: NONE SEEN [HPF]
GLUCOSE, UA: NEGATIVE
HGB URINE DIPSTICK: NEGATIVE
KETONES UR: NEGATIVE
LEUKOCYTES UA: NEGATIVE
NITRITE: NEGATIVE
PH: 8 (ref 5.0–8.0)
Protein, ur: NEGATIVE
SQUAMOUS EPITHELIAL / LPF: NONE SEEN [HPF] (ref <=5)
Specific Gravity, Urine: 1.009 (ref 1.001–1.035)
WBC, UA: NONE SEEN WBC/HPF (ref <=5)
Yeast: NONE SEEN [HPF]

## 2016-05-05 LAB — URINE CULTURE
Colony Count: NO GROWTH
Organism ID, Bacteria: NO GROWTH

## 2017-03-20 ENCOUNTER — Encounter: Payer: Self-pay | Admitting: Gynecology

## 2017-04-05 ENCOUNTER — Encounter: Payer: 59 | Admitting: Gynecology

## 2017-04-24 ENCOUNTER — Ambulatory Visit (INDEPENDENT_AMBULATORY_CARE_PROVIDER_SITE_OTHER): Payer: BLUE CROSS/BLUE SHIELD | Admitting: Gynecology

## 2017-04-24 ENCOUNTER — Encounter: Payer: Self-pay | Admitting: Gynecology

## 2017-04-24 VITALS — BP 118/76 | Ht 60.0 in | Wt 120.0 lb

## 2017-04-24 DIAGNOSIS — Z01411 Encounter for gynecological examination (general) (routine) with abnormal findings: Secondary | ICD-10-CM | POA: Diagnosis not present

## 2017-04-24 DIAGNOSIS — N952 Postmenopausal atrophic vaginitis: Secondary | ICD-10-CM

## 2017-04-24 DIAGNOSIS — M810 Age-related osteoporosis without current pathological fracture: Secondary | ICD-10-CM | POA: Diagnosis not present

## 2017-04-24 MED ORDER — ALENDRONATE SODIUM 70 MG PO TABS: 70.0000 mg | ORAL_TABLET | ORAL | 11 refills | Status: DC

## 2017-04-24 NOTE — Progress Notes (Signed)
    Denise [name] 04/12/1951 [id number]        66 y.o.  G2P2002 for annual exam.    Past medical history,surgical history, problem list, medications, allergies, family history and social history were all reviewed and documented as reviewed in the EPIC chart.  ROS:  Performed with pertinent positives and negatives included in the history, assessment and plan.   Additional significant findings :  None   Exam: Kennon Portela assistant Vitals:   04/24/17 1429  BP: 118/76  Weight: 120 lb (54.4 kg)  Height: 5' (1.524 m)   Body mass index is 23.44 kg/m.  General appearance:  Normal affect, orientation and appearance. Skin: Grossly normal HEENT: Without gross lesions.  No cervical or supraclavicular adenopathy. Thyroid normal.  Lungs:  Clear without wheezing, rales or rhonchi Cardiac: RR, without RMG Abdominal:  Soft, nontender, without masses, guarding, rebound, organomegaly or hernia Breasts:  Examined lying and sitting without masses, retractions, discharge or axillary adenopathy. Pelvic:  Ext, BUS, Vagina: With atrophic changes  Cervix: With atrophic changes  Uterus: Anteverted, normal size, shape and contour, midline and mobile nontender   Adnexa: Without masses or tenderness    Anus and perineum: Normal   Rectovaginal: Normal sphincter tone without palpated masses or tenderness.    Assessment/Plan:  66 y.o. G68P2002 female for annual exam.  1. Postmenopausal/atrophic genital changes.  No significant hot flushes, night sweats, vaginal dryness or any vaginal bleeding. Continue to monitor report any issues or bleeding. 2. Osteoporosis.  DEXA 03/2016 T score -3.4. Had been on Fosamax for 4 years discontinued 5-6 years ago. Reinitiated Fosamax last year. We'll continue on this for now and refill 1 year provided. Check vitamin D today as she was marginal previously and is on 2000 units daily. Plan follow up DEXA next year to year interval. 3. Mammography 02/2017. Continue with annual  mammography when due. SBE monthly reviewed. 4. Pap smear 2017. No Pap smear done today. No history of significant abnormal Pap smears. Plan repeat Pap smear at 3 year interval per current screening guidelines. 5. Colonoscopy 2012. Repeat at their recommended interval. 6. Health maintenance. No routine lab work done as patient does this elsewhere. Follow up in one year, sooner as needed.   Dara Lords MD, 2:56 PM 04/24/2017

## 2017-04-24 NOTE — Patient Instructions (Signed)

## 2017-04-25 ENCOUNTER — Other Ambulatory Visit: Payer: Self-pay | Admitting: Gynecology

## 2017-04-25 DIAGNOSIS — E559 Vitamin D deficiency, unspecified: Secondary | ICD-10-CM

## 2017-04-25 LAB — VITAMIN D 25 HYDROXY (VIT D DEFICIENCY, FRACTURES): Vit D, 25-Hydroxy: 30 ng/mL (ref 30–100)

## 2017-05-06 ENCOUNTER — Other Ambulatory Visit: Payer: Self-pay | Admitting: Gynecology

## 2017-11-08 ENCOUNTER — Ambulatory Visit
Admission: RE | Admit: 2017-11-08 | Discharge: 2017-11-08 | Disposition: A | Discharge status: Home / Self Care | Payer: BLUE CROSS/BLUE SHIELD | Source: Ambulatory Visit | Attending: Internal Medicine | Admitting: Internal Medicine

## 2017-11-08 ENCOUNTER — Other Ambulatory Visit: Payer: Self-pay | Admitting: Internal Medicine

## 2017-11-08 DIAGNOSIS — M542 Cervicalgia: Secondary | ICD-10-CM

## 2017-11-10 ENCOUNTER — Ambulatory Visit (HOSPITAL_COMMUNITY)
Admission: EM | Admit: 2017-11-10 | Discharge: 2017-11-10 | Disposition: A | Discharge status: Home / Self Care | Payer: BLUE CROSS/BLUE SHIELD | Attending: Family Medicine | Admitting: Family Medicine

## 2017-11-10 ENCOUNTER — Encounter (HOSPITAL_COMMUNITY): Payer: Self-pay | Admitting: Family Medicine

## 2017-11-10 DIAGNOSIS — M792 Neuralgia and neuritis, unspecified: Secondary | ICD-10-CM | POA: Diagnosis not present

## 2017-11-10 DIAGNOSIS — R519 Headache, unspecified: Secondary | ICD-10-CM

## 2017-11-10 DIAGNOSIS — R51 Headache: Secondary | ICD-10-CM

## 2017-11-10 DIAGNOSIS — M542 Cervicalgia: Secondary | ICD-10-CM | POA: Diagnosis not present

## 2017-11-10 MED ORDER — GABAPENTIN 300 MG PO CAPS: ORAL_CAPSULE | ORAL | 0 refills | Status: DC

## 2017-11-10 NOTE — ED Triage Notes (Signed)
Pt here for 1 month of intermittent neck pain with radiation into head with numbness and tingling. Reports rash to neck. She has been given prednisone and diclofenac.

## 2017-11-10 NOTE — Discharge Instructions (Signed)
Take the medication as directed. If you develop new symptoms or get worse follow-up with your doctor or if severe go to the emergency department. If you develop a severe headache or change in vision, weakness on one side of the body or other abnormalities, directly to the emergency department or call 911.

## 2017-11-10 NOTE — ED Provider Notes (Signed)
[facility]    CSN: [id number] Arrival date & time: 11/10/17  1456     History   Chief Complaint Chief Complaint  Patient presents with  . Headache  . Neck Pain    HPI Zollie BeckersJudy C Bickley is a 66 y.o. female.   66 year old female complaining of global scalp and neck paresthesias, occasional pain described as throbbing and numbness, tingling that started a month ago. It started in the back of the neck bilaterally and came out the occiput and toward the frontal area. It is bilateral. She has some numbness and tingling in the face. She was initially treated by her PCP with diclofenac on the second visit, on the first visit prednisone and cephalexin. These do not seem to work very well. She does not complain of focal weakness. Sometimes the pain has behind the left eye and now she has some throbbing in the left side of the head. Denies problems with vision, speech, hearing, swallowing, focal weakness. No problem with memory, orientation. No confusion or disorientation. No change in behavior according to the patient and her daughter accompanying her.      Past Medical History:  Diagnosis Date  . Irregular heart beat   . Kidney stone   . Osteoporosis 11/2013   T score -2.5 statistically significant increase in bone mineral density at all sites measured.  . Thyroid disease     Patient Active Problem List   Diagnosis Date Noted  . Thyroid disease   . Osteoporosis 11/24/2013    Past Surgical History:  Procedure Laterality Date  . DILATION AND CURETTAGE OF UTERUS    . EYE SURGERY    . HYSTEROSCOPY      OB History    Gravida Para Term Preterm AB Living   2 2 2     2    SAB TAB Ectopic Multiple Live Births                   Home Medications    Prior to Admission medications   Medication Sig Start Date End Date Taking? Authorizing Provider  alendronate (FOSAMAX) 70 MG tablet Take 1 tablet (70 mg total) by mouth every 7 (seven) days. Take with a full glass of water  on an empty stomach. 04/24/17   Fontaine, Nadyne Coombesimothy P, MD  alendronate (FOSAMAX) 70 MG tablet TAKE 1 TABLET EVERY 7 DAYS WITH FULL GLASS OF WATER ON EMPTY STOMACH 05/07/17   Fontaine, Nadyne Coombesimothy P, MD  atenolol (TENORMIN) 50 MG tablet Take 50 mg by mouth daily. Take 1/2 pill    [provider]  Cholecalciferol (VITAMIN D PO) Take by mouth.    [provider]  Dexlansoprazole (DEXILANT) 30 MG capsule Take 30 mg by mouth daily.    [provider]  gabapentin (NEURONTIN) 300 MG capsule 1 capsule by mouth q bedtime 2 d, then twice a day for 2 days then 3 times a day. 11/10/17   Hayden RasmussenMabe, Jovon Winterhalter, NP  levothyroxine (SYNTHROID, LEVOTHROID) 125 MCG tablet Take 125 mcg by mouth daily before breakfast. Take 1/2 pill    [provider]    Family History Family History  Problem Relation Age of Onset  . Dementia Mother   . Heart attack Father   . Cancer Sister        Cervical  . Heart disease Brother   . Breast cancer Maternal Aunt 65  . Cancer Brother        Lung cancer  . Cancer Sister  Cervical    Social History Social History   Tobacco Use  . Smoking status: Never Smoker  . Smokeless tobacco: Never Used  Substance Use Topics  . Alcohol use: Yes    Alcohol/week: 0.0 oz    Comment: Rare  . Drug use: No     Allergies   Valium [diazepam] and Codeine   Review of Systems Review of Systems  Constitutional: Negative for activity change, chills, diaphoresis and fever.  HENT: Negative for congestion, ear pain, hearing loss, nosebleeds, postnasal drip, sore throat and tinnitus.   Eyes:       See also HPI  Respiratory: Negative for cough and shortness of breath.   Cardiovascular: Negative for chest pain, palpitations and leg swelling.  Gastrointestinal: Negative for abdominal pain and blood in stool.  Genitourinary: Negative.   Musculoskeletal: Negative.   Skin: Negative for rash.  Neurological: Positive for numbness and headaches. Negative for dizziness,  tremors, seizures, syncope, facial asymmetry, speech difficulty, weakness and light-headedness.       See also HPI  Psychiatric/Behavioral: Negative.   All other systems reviewed and are negative.    Physical Exam Triage Vital Signs ED Triage Vitals  Enc Vitals Group     BP 11/10/17 1530 (!) 164/90     Pulse Rate 11/10/17 1530 (!) 55     Resp 11/10/17 1530 18     Temp 11/10/17 1530 97.9 F (36.6 C)     Temp src --      SpO2 11/10/17 1530 97 %     Weight --      Height --      Head Circumference --      Peak Flow --      Pain Score 11/10/17 1526 7     Pain Loc --      Pain Edu? --      Excl. in GC? --    No data found.  Updated Vital Signs BP (!) 164/90   Pulse (!) 55   Temp 97.9 F (36.6 C)   Resp 18   SpO2 97%   Visual Acuity Right Eye Distance:   Left Eye Distance:   Bilateral Distance:    Right Eye Near:   Left Eye Near:    Bilateral Near:     Physical Exam  Constitutional: She is oriented to person, place, and time. She appears well-developed and well-nourished. She does not appear ill. No distress.  HENT:  Head: Normocephalic and atraumatic.  Mild tenderness to the portion of the left parietal scalp.  Eyes: Conjunctivae and EOM are normal. Pupils are equal, round, and reactive to light.  Neck: Normal range of motion. Neck supple.  Cardiovascular: Normal rate, regular rhythm and normal heart sounds.  Pulmonary/Chest: Effort normal and breath sounds normal. No respiratory distress.  Musculoskeletal: Normal range of motion. She exhibits no edema.  Neurological: She is alert and oriented to person, place, and time. She has normal strength. She displays no tremor. No cranial nerve deficit or sensory deficit. She exhibits normal muscle tone. Coordination and gait normal. GCS eye subscore is 4. GCS verbal subscore is 5. GCS motor subscore is 6.  Neurologic exam is completely normal. Motor and sensory is grossly intact.  Skin: Skin is warm and dry. Capillary  refill takes less than 2 seconds. She is not diaphoretic.  2 very small red papules to the right posterior neck. No vesicles. No other signs of rash.  Psychiatric: Judgment normal.  Nursing note and vitals reviewed.  UC Treatments / Results  Labs (all labs ordered are listed, but only abnormal results are displayed) Labs Reviewed - No data to display  EKG  EKG Interpretation None       Radiology No results found.  Procedures Procedures (including critical care time)  Medications Ordered in UC Medications - No data to display   Initial Impression / Assessment and Plan / UC Course  I have reviewed the triage vital signs and the nursing notes.  Pertinent labs & imaging results that were available during my care of the patient were reviewed by me and considered in my medical decision making (see chart for details).    Take the medication as directed. If you develop new symptoms or get worse follow-up with your doctor or if severe go to the emergency department. If you develop a severe headache or change in vision, weakness on one side of the body or other abnormalities, directly to the emergency department or call 911.    Final Clinical Impressions(s) / UC Diagnoses   Final diagnoses:  Neck pain  Scalp pain  Neuralgia involving scalp    ED Discharge Orders        Ordered    gabapentin (NEURONTIN) 300 MG capsule     11/10/17 1611       Controlled Substance Prescriptions [place] Controlled Substance Registry consulted? Not Applicable   Hayden RasmussenMabe, Yadir Zentner, NP 11/10/17 1622

## 2018-03-09 DIAGNOSIS — E890 Postprocedural hypothyroidism: Secondary | ICD-10-CM | POA: Insufficient documentation

## 2018-03-09 DIAGNOSIS — E0500 Thyrotoxicosis with diffuse goiter without thyrotoxic crisis or storm: Secondary | ICD-10-CM | POA: Insufficient documentation

## 2018-05-23 ENCOUNTER — Ambulatory Visit: Payer: Medicare Other | Admitting: Gynecology

## 2018-05-23 ENCOUNTER — Encounter: Payer: Self-pay | Admitting: Gynecology

## 2018-05-23 VITALS — BP 118/78 | Ht 60.0 in | Wt 122.0 lb

## 2018-05-23 DIAGNOSIS — N952 Postmenopausal atrophic vaginitis: Secondary | ICD-10-CM

## 2018-05-23 DIAGNOSIS — Z01419 Encounter for gynecological examination (general) (routine) without abnormal findings: Secondary | ICD-10-CM

## 2018-05-23 DIAGNOSIS — M810 Age-related osteoporosis without current pathological fracture: Secondary | ICD-10-CM

## 2018-05-23 NOTE — Progress Notes (Signed)
    MAILEN NEWBORN 09/14/1951 [id number]        67 y.o.  [id number] for annual gynecologic exam.  Doing well without gynecologic complaints.  Past medical history,surgical history, problem list, medications, allergies, family history and social history were all reviewed and documented as reviewed in the EPIC chart.  ROS:  Performed with pertinent positives and negatives included in the history, assessment and plan.   Additional significant findings : None   Exam: Kennon Portela assistant Vitals:   05/23/18 1128  BP: 118/78  Weight: 122 lb (55.3 kg)  Height: 5' (1.524 m)   Body mass index is 23.83 kg/m.  General appearance:  Normal affect, orientation and appearance. Skin: Grossly normal HEENT: Without gross lesions.  No cervical or supraclavicular adenopathy. Thyroid normal.  Lungs:  Clear without wheezing, rales or rhonchi Cardiac: RR, without RMG Abdominal:  Soft, nontender, without masses, guarding, rebound, organomegaly or hernia Breasts:  Examined lying and sitting without masses, retractions, discharge or axillary adenopathy. Pelvic:  Ext, BUS, Vagina: With atrophic changes  Cervix: With atrophic changes.  Pap smear done  Uterus: Anteverted, normal size, shape and contour, midline and mobile nontender   Adnexa: Without masses or tenderness    Anus and perineum: Normal   Rectovaginal: Normal sphincter tone without palpated masses or tenderness.    Assessment/Plan:  67 y.o. G44P2002 female for annual gynecologic exam.   1. Postmenopausal/atrophic genital changes.  No significant hot flushes, night sweats, vaginal dryness or bleeding. 2. Osteoporosis.  DEXA 2017 T score -3.4.  Been on Fosamax for 4 years but discontinued 6 years ago.  Reinitiated Fosamax 2 years ago but discontinued this past year because of everything she was reading she got scared.  Was not having any side effects.  Recommend DEXA now and then rediscuss treatment options based on these results and patient will  schedule in follow-up with me. 3. Mammography 02/2018.  Continue with annual mammography when due.  Breast exam normal today. 4. Pap smear 03/2016.  Pap smear done today.  No history of abnormal Pap smears previously.  Options to stop screening per current screening guidelines based on age reviewed. 5. Colonoscopy 2012 with reported repeat interval 10 years. 6. Health maintenance.  No routine lab work done as patient does this elsewhere.  Follow-up 1 year, sooner as needed.   Dara Lords MD, 11:59 AM 05/23/2018

## 2018-05-23 NOTE — Patient Instructions (Signed)
Schedule your bone density and we will discuss the results after it is done.  Follow-up in 1 year for annual exam.

## 2018-05-23 NOTE — Addendum Note (Signed)
Addended by: Dayna Barker on: 05/23/2018 12:15 PM   Modules accepted: Orders

## 2018-05-24 LAB — PAP IG W/ RFLX HPV ASCU

## 2019-05-27 ENCOUNTER — Encounter: Payer: Medicare Other | Admitting: Gynecology

## 2019-06-25 ENCOUNTER — Encounter: Payer: Medicare Other | Admitting: Gynecology

## 2019-07-16 ENCOUNTER — Ambulatory Visit (INDEPENDENT_AMBULATORY_CARE_PROVIDER_SITE_OTHER): Payer: Medicare Other | Admitting: Gynecology

## 2019-07-16 ENCOUNTER — Other Ambulatory Visit: Payer: Self-pay

## 2019-07-16 ENCOUNTER — Encounter: Payer: Self-pay | Admitting: Gynecology

## 2019-07-16 VITALS — BP 124/82 | Ht 60.0 in | Wt 116.0 lb

## 2019-07-16 DIAGNOSIS — M810 Age-related osteoporosis without current pathological fracture: Secondary | ICD-10-CM

## 2019-07-16 DIAGNOSIS — N952 Postmenopausal atrophic vaginitis: Secondary | ICD-10-CM | POA: Diagnosis not present

## 2019-07-16 DIAGNOSIS — Z01419 Encounter for gynecological examination (general) (routine) without abnormal findings: Secondary | ICD-10-CM | POA: Diagnosis not present

## 2019-07-16 DIAGNOSIS — E559 Vitamin D deficiency, unspecified: Secondary | ICD-10-CM

## 2019-07-16 NOTE — Patient Instructions (Signed)
Follow up for the bone density as scheduled  Schedule your mammogram. 

## 2019-07-16 NOTE — Progress Notes (Signed)
    PHILICIA HEYNE [birth year] [id number]        68 y.o.  G2P2002 for annual gynecologic exam.  Several issues noted below  Past medical history,surgical history, problem list, medications, allergies, family history and social history were all reviewed and documented as reviewed in the EPIC chart.  ROS:  Performed with pertinent positives and negatives included in the history, assessment and plan.   Additional significant findings : None   Exam: Caryn Bee assistant Vitals:   07/16/19 1523  BP: 124/82  Weight: 116 lb (52.6 kg)  Height: 5' (1.524 m)   Body mass index is 22.65 kg/m.  General appearance:  Normal affect, orientation and appearance. Skin: Grossly normal HEENT: Without gross lesions.  No cervical or supraclavicular adenopathy. Thyroid normal.  Lungs:  Clear without wheezing, rales or rhonchi Cardiac: RR, without RMG Abdominal:  Soft, nontender, without masses, guarding, rebound, organomegaly or hernia Breasts:  Examined lying and sitting without masses, retractions, discharge or axillary adenopathy. Pelvic:  Ext, BUS, Vagina: With atrophic changes  Cervix: With atrophic changes  Uterus: Anteverted, normal size, shape and contour, midline and mobile nontender   Adnexa: Without masses or tenderness    Anus and perineum: Normal   Rectovaginal: Normal sphincter tone without palpated masses or tenderness.    Assessment/Plan:  69 y.o. G49P2002 female for annual gynecologic exam.   1. Postmenopausal.  No significant menopausal symptoms or any vaginal bleeding. 2. Osteoporosis.  DEXA 2017 T score -3.4.  Had been on Fosamax for approximately 4 years discontinued 7 years ago.  Started back 3 years ago but discontinued after year because of being afraid of side effects that she read about.  She was has not having any side effects personally.  Was to redo her DEXA last year but failed to follow-up.  Will schedule DEXA now and then rediscuss following the DEXA.  Check vitamin D  level today as she has a history of vitamin D deficiency and is supplementing. 3. Mammography overdue and I reminded her to schedule this.  Breast exam normal today. 4. Pap smear 2019.  No Pap smear done today.  No history of abnormal Pap smears.  Options to stop screening per current screening guidelines reviewed.  Will readdress on an annual basis. 5. Colonoscopy 2012.  Repeat at their recommended 10-year interval. 6. Health maintenance.  No routine lab work done as patient does this elsewhere.  Follow-up for DEXA.  Follow-up in 1 year for annual exam.   Anastasio Auerbach MD, 3:49 PM 07/16/2019

## 2019-07-17 LAB — VITAMIN D 25 HYDROXY (VIT D DEFICIENCY, FRACTURES): Vit D, 25-Hydroxy: 39 ng/mL (ref 30–100)

## 2019-07-30 ENCOUNTER — Ambulatory Visit (INDEPENDENT_AMBULATORY_CARE_PROVIDER_SITE_OTHER): Payer: Medicare Other

## 2019-07-30 ENCOUNTER — Other Ambulatory Visit: Payer: Self-pay | Admitting: Gynecology

## 2019-07-30 ENCOUNTER — Other Ambulatory Visit: Payer: Self-pay

## 2019-07-30 DIAGNOSIS — Z780 Asymptomatic menopausal state: Secondary | ICD-10-CM | POA: Diagnosis not present

## 2019-07-30 DIAGNOSIS — M810 Age-related osteoporosis without current pathological fracture: Secondary | ICD-10-CM

## 2019-08-04 ENCOUNTER — Telehealth: Payer: Self-pay | Admitting: Gynecology

## 2019-08-04 ENCOUNTER — Encounter: Payer: Self-pay | Admitting: Gynecology

## 2019-08-04 NOTE — Telephone Encounter (Signed)
Tell patient her DEXA overall is stable from her last study.  It still shows significant osteoporosis and she continues have an increased risk of fracture.  My recommendation would be to restart alendronate as she has done well on this in the past.  If she agrees then alendronate 70 mg weekly.  Otherwise other options to include Prolia that we can discuss at office visit if she wants to discuss treatment options.

## 2019-08-05 NOTE — Telephone Encounter (Signed)
Left message for pt to call.

## 2019-08-07 MED ORDER — ALENDRONATE SODIUM 70 MG PO TABS: 70.0000 mg | ORAL_TABLET | ORAL | 11 refills | Status: DC

## 2019-08-07 NOTE — Telephone Encounter (Signed)
Patient was fine with restarting alendronate 70 mg tablet, Rx sent.

## 2019-10-01 ENCOUNTER — Encounter: Payer: Self-pay | Admitting: Gynecology

## 2019-10-15 ENCOUNTER — Encounter: Payer: Self-pay | Admitting: Gynecology

## 2019-10-21 ENCOUNTER — Encounter: Payer: Self-pay | Admitting: Gynecology

## 2019-10-27 ENCOUNTER — Other Ambulatory Visit: Payer: Self-pay | Admitting: Radiology

## 2019-10-28 ENCOUNTER — Encounter: Payer: Self-pay | Admitting: Gynecology

## 2019-10-29 ENCOUNTER — Encounter: Payer: Self-pay | Admitting: Gynecology

## 2020-03-27 ENCOUNTER — Emergency Department (HOSPITAL_COMMUNITY)
Admission: EM | Admit: 2020-03-27 | Discharge: 2020-03-27 | Disposition: A | Discharge status: Home / Self Care | Payer: Medicare Other | Attending: Emergency Medicine | Admitting: Emergency Medicine

## 2020-03-27 ENCOUNTER — Emergency Department (HOSPITAL_COMMUNITY): Payer: Medicare Other

## 2020-03-27 ENCOUNTER — Encounter (HOSPITAL_COMMUNITY): Payer: Self-pay | Admitting: *Deleted

## 2020-03-27 ENCOUNTER — Other Ambulatory Visit: Payer: Self-pay

## 2020-03-27 DIAGNOSIS — Y9289 Other specified places as the place of occurrence of the external cause: Secondary | ICD-10-CM | POA: Diagnosis not present

## 2020-03-27 DIAGNOSIS — Y999 Unspecified external cause status: Secondary | ICD-10-CM | POA: Diagnosis not present

## 2020-03-27 DIAGNOSIS — S93492A Sprain of other ligament of left ankle, initial encounter: Secondary | ICD-10-CM | POA: Diagnosis not present

## 2020-03-27 DIAGNOSIS — Z79899 Other long term (current) drug therapy: Secondary | ICD-10-CM | POA: Insufficient documentation

## 2020-03-27 DIAGNOSIS — W101XXA Fall (on)(from) sidewalk curb, initial encounter: Secondary | ICD-10-CM | POA: Diagnosis not present

## 2020-03-27 DIAGNOSIS — Y9301 Activity, walking, marching and hiking: Secondary | ICD-10-CM | POA: Diagnosis not present

## 2020-03-27 DIAGNOSIS — S99912A Unspecified injury of left ankle, initial encounter: Secondary | ICD-10-CM | POA: Diagnosis present

## 2020-03-27 MED ORDER — IBUPROFEN 800 MG PO TABS
800.0000 mg | ORAL_TABLET | Freq: Once | ORAL | Status: AC
  Administered 2020-03-27: 800 mg via ORAL
  Filled 2020-03-27: qty 1

## 2020-03-27 MED ORDER — HYDROCODONE-ACETAMINOPHEN 5-325 MG PO TABS
1.0000 | ORAL_TABLET | Freq: Once | ORAL | Status: AC
  Administered 2020-03-27: 1 via ORAL
  Filled 2020-03-27: qty 1

## 2020-03-27 MED ORDER — HYDROCODONE-ACETAMINOPHEN 5-325 MG PO TABS: 1.0000 | ORAL_TABLET | Freq: Four times a day (QID) | ORAL | 0 refills | Status: DC | PRN

## 2020-03-27 MED ORDER — IBUPROFEN 800 MG PO TABS: 800.0000 mg | ORAL_TABLET | Freq: Three times a day (TID) | ORAL | 0 refills | Status: AC | PRN

## 2020-03-27 NOTE — ED Notes (Signed)
Ortho present to place ASO and instruct crutch walking

## 2020-03-27 NOTE — Discharge Instructions (Addendum)
Please read the attachments.  Please take ibuprofen 800 mg 3 times daily as needed for your symptoms of discomfort.  Please discontinue if you develop any epigastric discomfort or black stools.  You may take the Vicodin, as prescribed, for breakthrough pain.  Please call your orthopedist, Dr. August Saucer, for ongoing evaluation management of your left ankle injury.  Please return to the ED or seek immediate medical attention should experience any new or worsening symptoms.

## 2020-03-27 NOTE — ED Triage Notes (Signed)
Pt rolled L ankle as she stepped in a hole PTA. Good pulse, tenderness to ankle

## 2020-03-27 NOTE — ED Notes (Signed)
Ice pack applied to ankle

## 2020-03-27 NOTE — ED Provider Notes (Signed)
[facility]-EMERGENCY DEPT Provider Note   CSN: [id number] Arrival date & time: 03/27/20  1549     History Chief Complaint  Patient presents with  . Ankle Pain    Denise [name] is a 69 y.o. female with no relevant PMH who presents to the ED accompanied by her husband after sustaining mechanical injury.  Patient reports that she was walking on a sidewalk at the [facility] when she accidentally stepped off into a ditch and inverted her left foot.  She complains of 10 out of 10 left ankle discomfort.  She has been unable to bear weight since the injury.  Her husband was able to carry her to her car and then transport her here to the hospital for evaluation.  She has taken "a half of a Xanax" for her symptoms of discomfort.  She denies any open wounds, numbness, cold extremity, or other injury.  HPI     Past Medical History:  Diagnosis Date  . Irregular heart beat   . Kidney stone   . Osteoporosis 07/2019   T score -3.5 overall stable from prior DEXA  . Thyroid disease     Patient Active Problem List   Diagnosis Date Noted  . Thyroid disease   . Osteoporosis 11/24/2013    Past Surgical History:  Procedure Laterality Date  . DILATION AND CURETTAGE OF UTERUS    . EYE SURGERY    . HYSTEROSCOPY       OB History    Gravida  2   Para  2   Term  2   Preterm      AB      Living  2     SAB      TAB      Ectopic      Multiple      Live Births              Family History  Problem Relation Age of Onset  . Dementia Mother   . Heart attack Father   . Cancer Sister        Cervical  . Lung cancer Sister   . Heart disease Brother   . Breast cancer Maternal Aunt 65  . Cancer Brother        Lung cancer  . Cancer Sister        Cervical    Social History   Tobacco Use  . Smoking status: Never Smoker  . Smokeless tobacco: Never Used  Substance Use Topics  . Alcohol use: Never    Alcohol/week: 0.0 standard drinks  . Drug use: No     Home Medications Prior to Admission medications   Medication Sig Start Date End Date Taking? Authorizing Provider  alendronate (FOSAMAX) 70 MG tablet Take 1 tablet (70 mg total) by mouth every 7 (seven) days. Take with a full glass of water on an empty stomach. 08/07/19   Fontaine, Nadyne Coombes, MD  atenolol (TENORMIN) 50 MG tablet Take 50 mg by mouth daily. Take 1/2 pill    [provider]  Cholecalciferol (VITAMIN D PO) Take by mouth.    [provider]  Dexlansoprazole (DEXILANT) 30 MG capsule Take 30 mg by mouth daily.    [provider]  HYDROcodone-acetaminophen (NORCO/VICODIN) 5-325 MG tablet Take 1 tablet by mouth every 6 (six) hours as needed for severe pain. 03/27/20   Lorelee New, PA-C  ibuprofen (ADVIL) 800 MG tablet Take 1 tablet (800 mg total) by mouth 3 (  three) times daily with meals as needed for moderate pain. 03/27/20   Corena Herter, PA-C  levothyroxine (SYNTHROID, LEVOTHROID) 125 MCG tablet Take 125 mcg by mouth daily before breakfast. Take 1/2 pill    [provider]    Allergies    Valium [diazepam] and Codeine  Review of Systems   Review of Systems  Musculoskeletal: Positive for arthralgias and joint swelling.  Skin: Positive for color change. Negative for wound.  Neurological: Negative for weakness and numbness.    Physical Exam Updated Vital Signs BP (!) 111/91 (BP Location: Left Arm)   Pulse 72   Temp 98.3 F (36.8 C) (Oral)   Resp 18   Ht 5' (1.524 m)   Wt 51.7 kg   SpO2 98%   BMI 22.26 kg/m   Physical Exam Vitals and nursing note reviewed. Exam conducted with a chaperone present.  Constitutional:      Appearance: Normal appearance.  HENT:     Head: Normocephalic and atraumatic.  Eyes:     General: No scleral icterus.    Conjunctiva/sclera: Conjunctivae normal.  Pulmonary:     Effort: Pulmonary effort is normal.  Musculoskeletal:     Comments: Left ankle: Significant tenderness to palpation diffusely  over ankle.  Able to wiggle toes.  Cannot flex or dorsiflex foot due to discomfort.  No midshaft tibial TTP, but TTP distally.  Ecchymoses noted inferior to lateral malleolus.  Compartments are soft.  Pedal pulse intact.  Cannot assess cap refill due to nail polish.   Left knee: Normal.  No TTP.  ROM intact.  Skin:    General: Skin is dry.  Neurological:     Mental Status: She is alert.     GCS: GCS eye subscore is 4. GCS verbal subscore is 5. GCS motor subscore is 6.  Psychiatric:        Mood and Affect: Mood normal.        Behavior: Behavior normal.        Thought Content: Thought content normal.     ED Results / Procedures / Treatments   Labs (all labs ordered are listed, but only abnormal results are displayed) Labs Reviewed - No data to display  EKG None  Radiology DG Foot Complete Left  Result Date: 03/27/2020 CLINICAL DATA:  Pain in the dorsum of the foot post injury. EXAM: LEFT FOOT - COMPLETE 3+ VIEW COMPARISON:  None. FINDINGS: There is no evidence of fracture or dislocation. There is no evidence of arthropathy or other focal bone abnormality. Soft tissues are unremarkable. IMPRESSION: Negative. Electronically Signed   By: Fidela Salisbury M.D.   On: 03/27/2020 17:29    Procedures Procedures (including critical care time)  Medications Ordered in ED Medications  ibuprofen (ADVIL) tablet 800 mg (800 mg Oral Given 03/27/20 1721)  HYDROcodone-acetaminophen (NORCO/VICODIN) 5-325 MG per tablet 1 tablet (1 tablet Oral Given 03/27/20 1754)    ED Course  I have reviewed the triage vital signs and the nursing notes.  Pertinent labs & imaging results that were available during my care of the patient were reviewed by me and considered in my medical decision making (see chart for details).    MDM Rules/Calculators/A&P                      I personally reviewed plain films obtained of left ankle which demonstrates no evidence of fracture or dislocation.  Soft tissues are  unremarkable and there are no acute osseous abnormalities.  Patient felt significant improvement after receiving the ibuprofen 800 mg as well as the Vicodin 5-325 here in the ED.  She was able to demonstrate dorsiflexion and flexion of affected ankle.  No significant swelling concerning for compartment syndrome.  Pedal pulse remains intact.  Able to wiggle toes.  Overall reassuring exam.   We will place patient in ASO splint and provide crutches.  She is followed by Dr. Rise Paganini here in [place], [place].  Will refer her to him for ongoing evaluation and management.  Strict ED return precautions discussed.  All of the evaluation and work-up results were discussed with the patient and any family at bedside. They were provided opportunity to ask any additional questions and have none at this time. They have expressed understanding of verbal discharge instructions as well as return precautions and are agreeable to the plan.    Final Clinical Impression(s) / ED Diagnoses Final diagnoses:  Sprain of anterior talofibular ligament of left ankle, initial encounter    Rx / DC Orders ED Discharge Orders         Ordered    ibuprofen (ADVIL) 800 MG tablet  3 times daily with meals PRN     03/27/20 1838    HYDROcodone-acetaminophen (NORCO/VICODIN) 5-325 MG tablet  Every 6 hours PRN     03/27/20 1838           Lorelee New, PA-C 03/27/20 Verdie Shire, MD 03/28/20 0040

## 2020-03-27 NOTE — ED Notes (Signed)
Ortho contacted to apply brace  

## 2020-03-30 ENCOUNTER — Ambulatory Visit: Payer: Self-pay

## 2020-03-30 ENCOUNTER — Ambulatory Visit (INDEPENDENT_AMBULATORY_CARE_PROVIDER_SITE_OTHER): Payer: Medicare Other | Admitting: Orthopedic Surgery

## 2020-03-30 ENCOUNTER — Other Ambulatory Visit: Payer: Self-pay

## 2020-03-30 ENCOUNTER — Encounter: Payer: Self-pay | Admitting: Orthopedic Surgery

## 2020-03-30 VITALS — Ht 60.0 in | Wt 114.0 lb

## 2020-03-30 DIAGNOSIS — R0789 Other chest pain: Secondary | ICD-10-CM

## 2020-03-30 DIAGNOSIS — M25511 Pain in right shoulder: Secondary | ICD-10-CM | POA: Diagnosis not present

## 2020-03-30 DIAGNOSIS — R0781 Pleurodynia: Secondary | ICD-10-CM | POA: Diagnosis not present

## 2020-03-30 DIAGNOSIS — M25572 Pain in left ankle and joints of left foot: Secondary | ICD-10-CM

## 2020-03-31 ENCOUNTER — Encounter: Payer: Self-pay | Admitting: Orthopedic Surgery

## 2020-03-31 NOTE — Progress Notes (Signed)
Office Visit Note   Patient: Denise [name]           Date of Birth: 1951/04/18           MRN: [id number] Visit Date: 03/30/2020              Requested by: Lorene Dy, [address], [place],  [place] [address] PCP: Lorene Dy, MD  Chief Complaint  Patient presents with  . Left Foot - Follow-up    [facility] ER 03/27/20 DOI rolled foot off curb  . Right Shoulder - Pain      HPI: Patient is a 69 year old woman who states that she tripped over a curb twisting her left ankle landing on her right ribs and right shoulder.  Patient states she started having soreness in the ribs and shoulder the next day.  She was initially seen at [facility] emergency room on April 3.  Radiographs are within normal limits from the emergency room.  Assessment & Plan: Visit Diagnoses:  1. Acute pain of right shoulder   2. Rib pain on right side   3. Pain in left ankle and joints of left foot     Plan: Recommend a fracture boot to give her more support for her left ankle she will wean out of this and use the ASO that she has she may be weightbearing as tolerated.  Follow-Up Instructions: Return in about 3 months (around 06/29/2020).   Ortho Exam  Patient is alert, oriented, no adenopathy, well-dressed, normal affect, normal respiratory effort. Examination patient does have ecchymosis and bruising medial and lateral to the left ankle anterior drawer is stable she has good range of motion no instability she is tender to palpation over the deltoid and lateral ankle ligaments.  The syndesmosis is nontender with compression.  She is tender to palpation over the ribs and interspace on the right side she has some mild pain with Neer and Hawkins impingement test of the right shoulder.  Imaging: No results found. No images are attached to the encounter.  Labs: Lab Results  Component Value Date   LABORGA NO GROWTH 05/03/2016     Lab Results  Component Value Date   ALBUMIN 4.0 04/14/2016    No  results found for: MG Lab Results  Component Value Date   VD25OH 39 07/16/2019   VD25OH 30 04/24/2017   VD25OH 30 04/06/2016    No results found for: PREALBUMIN CBC EXTENDED Latest Ref Rng & Units 07/24/2011  HGB 12.0 - 15.0 g/dL 12.6     Body mass index is 22.26 kg/m.  Orders:  Orders Placed This Encounter  Procedures  . XR Shoulder Right  . XR Ribs Unilateral Right  . XR Ankle Complete Left   No orders of the defined types were placed in this encounter.    Procedures: No procedures performed  Clinical Data: No additional findings.  ROS:  All other systems negative, except as noted in the HPI. Review of Systems  Objective: Vital Signs: Ht 5' (1.524 m)   Wt 114 lb (51.7 kg)   BMI 22.26 kg/m   Specialty Comments:  No specialty comments available.  PMFS History: Patient Active Problem List   Diagnosis Date Noted  . Thyroid disease   . Osteoporosis 11/24/2013   Past Medical History:  Diagnosis Date  . Irregular heart beat   . Kidney stone   . Osteoporosis 07/2019   T score -3.5 overall stable from prior DEXA  . Thyroid disease  Family History  Problem Relation Age of Onset  . Dementia Mother   . Heart attack Father   . Cancer Sister        Cervical  . Lung cancer Sister   . Heart disease Brother   . Breast cancer Maternal Aunt 65  . Cancer Brother        Lung cancer  . Cancer Sister        Cervical    Past Surgical History:  Procedure Laterality Date  . DILATION AND CURETTAGE OF UTERUS    . EYE SURGERY    . HYSTEROSCOPY     Social History   Occupational History  . Not on file  Tobacco Use  . Smoking status: Never Smoker  . Smokeless tobacco: Never Used  Substance and Sexual Activity  . Alcohol use: Never    Alcohol/week: 0.0 standard drinks  . Drug use: No  . Sexual activity: Yes    Birth control/protection: Post-menopausal    Comment: 1st intercourse 83 yo-1 partner

## 2020-04-27 ENCOUNTER — Ambulatory Visit: Payer: Medicare Other | Admitting: Orthopedic Surgery

## 2020-07-16 ENCOUNTER — Encounter: Payer: Medicare Other | Admitting: Gynecology

## 2020-07-16 ENCOUNTER — Encounter: Payer: Medicare Other | Admitting: Obstetrics and Gynecology

## 2020-07-19 ENCOUNTER — Encounter: Payer: Self-pay | Admitting: Obstetrics and Gynecology

## 2020-07-19 ENCOUNTER — Ambulatory Visit: Payer: Medicare Other | Admitting: Obstetrics and Gynecology

## 2020-07-19 ENCOUNTER — Other Ambulatory Visit: Payer: Self-pay

## 2020-07-19 VITALS — BP 120/78 | Ht 60.0 in | Wt 123.0 lb

## 2020-07-19 DIAGNOSIS — M810 Age-related osteoporosis without current pathological fracture: Secondary | ICD-10-CM | POA: Diagnosis not present

## 2020-07-19 DIAGNOSIS — Z01419 Encounter for gynecological examination (general) (routine) without abnormal findings: Secondary | ICD-10-CM

## 2020-07-19 NOTE — Progress Notes (Signed)
MADELENA MATURIN [birth year] [id number]  SUBJECTIVE:  69 y.o. G2P2002 female for annual routine gynecologic exam. She has no gynecologic concerns.  Current Outpatient Medications  Medication Sig Dispense Refill  . alendronate (FOSAMAX) 70 MG tablet Take 1 tablet (70 mg total) by mouth every 7 (seven) days. Take with a full glass of water on an empty stomach. 4 tablet 11  . atenolol (TENORMIN) 50 MG tablet Take 50 mg by mouth daily. Take 1/2 pill    . Cholecalciferol (VITAMIN D PO) Take by mouth.    . Dexlansoprazole (DEXILANT) 30 MG capsule Take 30 mg by mouth daily.    Marland Kitchen ibuprofen (ADVIL) 800 MG tablet Take 1 tablet (800 mg total) by mouth 3 (three) times daily with meals as needed for moderate pain. 21 tablet 0  . levothyroxine (SYNTHROID, LEVOTHROID) 125 MCG tablet Take 125 mcg by mouth daily before breakfast. Take 1/2 pill     No current facility-administered medications for this visit.   Allergies: Valium [diazepam] and Codeine  No LMP recorded. Patient is postmenopausal.  Past medical history,surgical history, problem list, medications, allergies, family history and social history were all reviewed and documented as reviewed in the EPIC chart.  ROS:  Feeling well. No dyspnea or chest pain on exertion.  No abdominal pain, change in bowel habits, black or bloody stools.  No urinary tract symptoms. GYN ROS: no abnormal bleeding, pelvic pain or discharge, no breast pain or new or enlarging lumps on self exam.No neurological complaints.   OBJECTIVE:  Ht 5' (1.524 m)   Wt 123 lb (55.8 kg)   BMI 24.02 kg/m  The patient appears well, alert, oriented x 3, in no distress. ENT normal.  Neck supple. No cervical or supraclavicular adenopathy or thyromegaly.  Lungs are clear, good air entry, no wheezes, rhonchi or rales. S1 and S2 normal, no murmurs, regular rate and rhythm.  Abdomen soft without tenderness, guarding, mass or organomegaly.  Neurological is normal, no focal findings.  BREAST  EXAM: breasts appear normal, no suspicious masses, no skin or nipple changes or axillary nodes  PELVIC EXAM: VULVA: normal appearing vulva with no masses, tenderness or lesions, atrophic changes, VAGINA: normal appearing vagina with normal color and discharge, no lesions, CERVIX: normal appearing cervix without discharge or lesions, UTERUS: uterus is normal size, shape, consistency and nontender, ADNEXA: normal adnexa in size, nontender and no masses  Chaperone: Kennon Portela present during the examination  ASSESSMENT:  69 y.o. [id number] here for annual gynecologic exam  PLAN:   1. Postmenopausal. No significant hot flashes or night sweats. No vaginal bleeding. 2. Pap smear 2019. No significant history of abnormal Pap smears. Discussed current screening guidelines and option to discontinue screening after age 32. Will readdress next year if she desires to continue screening or not. 3. Mammogram 09/2019.  Normal breast exam today.  Last year she had a right breast needle biopsy which indicated benign intraductal papilloma with calcifications. She is reminded to schedule an annual mammogram this year when due. 4. Colonoscopy 2012.  Recommended that she follow up at the recommended interval.   5.  Osteoporosis.  DEXA 07/2019.  T score -3.5.  Had been on Fosamax for 4 years, discontinued this 8 years ago.  She did start back 4 years ago but then discontinued after a year because she had concerns about side effects that she read about that she was not experiencing personally.  She then got restarted on Fosamax again last year 07/2019. She admits to  inconsistent use, I discussed the risks of major fractures with untreated osteoporosis and health consequences associated with that. She pledges to be more regular about use of the medication. Her other option would be Prolia injections which she would prefer not to do at this time. History of vitamin D deficiency with normal level last year. We will recheck DEXA  next year. 6. Health maintenance.  No labs today as she normally has these completed elsewhere.  Return annually or sooner, prn.   Theresia Majors MD 07/19/20

## 2020-10-19 ENCOUNTER — Encounter: Payer: Self-pay | Admitting: Obstetrics and Gynecology

## 2020-12-14 ENCOUNTER — Other Ambulatory Visit: Payer: Self-pay

## 2020-12-14 ENCOUNTER — Encounter (INDEPENDENT_AMBULATORY_CARE_PROVIDER_SITE_OTHER): Payer: Self-pay | Admitting: Otolaryngology

## 2020-12-14 ENCOUNTER — Ambulatory Visit (INDEPENDENT_AMBULATORY_CARE_PROVIDER_SITE_OTHER): Payer: Medicare Other | Admitting: Otolaryngology

## 2020-12-14 VITALS — Temp 97.2°F

## 2020-12-14 DIAGNOSIS — H6123 Impacted cerumen, bilateral: Secondary | ICD-10-CM | POA: Diagnosis not present

## 2020-12-14 NOTE — Progress Notes (Signed)
HPI: Denise [name] is a 69 y.o. female who presents for evaluation of cerumen buildup in both ears obstructing hearing especially on the right side.  She was last cleaned a little over 2 years ago..  Past Medical History:  Diagnosis Date  . Irregular heart beat   . Kidney stone   . Osteoporosis 07/2019   T score -3.5 overall stable from prior DEXA  . Thyroid disease    Past Surgical History:  Procedure Laterality Date  . DILATION AND CURETTAGE OF UTERUS    . EYE SURGERY    . HYSTEROSCOPY     Social History   Socioeconomic History  . Marital status: Married    Spouse name: Not on file  . Number of children: Not on file  . Years of education: Not on file  . Highest education level: Not on file  Occupational History  . Not on file  Tobacco Use  . Smoking status: Never Smoker  . Smokeless tobacco: Never Used  Vaping Use  . Vaping Use: Never used  Substance and Sexual Activity  . Alcohol use: Never    Alcohol/week: 0.0 standard drinks  . Drug use: No  . Sexual activity: Yes    Birth control/protection: Post-menopausal    Comment: 1st intercourse 89 yo-1 partner  Other Topics Concern  . Not on file  Social History Narrative  . Not on file   Social Determinants of Health   Financial Resource Strain: Not on file  Food Insecurity: Not on file  Transportation Needs: Not on file  Physical Activity: Not on file  Stress: Not on file  Social Connections: Not on file   Family History  Problem Relation Age of Onset  . Dementia Mother   . Heart attack Father   . Cancer Sister        Cervical  . Lung cancer Sister   . Heart disease Brother   . Breast cancer Maternal Aunt 65  . Cancer Brother        Lung cancer  . Cancer Sister        Cervical   Allergies  Allergen Reactions  . Valium [Diazepam]     Hyperventilate  . Codeine Itching   Prior to Admission medications   Medication Sig Start Date End Date Taking? Authorizing Provider  alendronate (FOSAMAX) 70 MG  tablet Take 1 tablet (70 mg total) by mouth every 7 (seven) days. Take with a full glass of water on an empty stomach. 08/07/19   Fontaine, Nadyne Coombes, MD  atenolol (TENORMIN) 50 MG tablet Take 50 mg by mouth daily. Take 1/2 pill    [provider]  Cholecalciferol (VITAMIN D PO) Take by mouth.    [provider]  Dexlansoprazole (DEXILANT) 30 MG capsule Take 30 mg by mouth daily.    [provider]  ibuprofen (ADVIL) 800 MG tablet Take 1 tablet (800 mg total) by mouth 3 (three) times daily with meals as needed for moderate pain. 03/27/20   Lorelee New, PA-C  levothyroxine (SYNTHROID, LEVOTHROID) 125 MCG tablet Take 125 mcg by mouth daily before breakfast. Take 1/2 pill    [provider]     Positive ROS: Otherwise negative  All other systems have been reviewed and were otherwise negative with the exception of those mentioned in the HPI and as above.  Physical Exam: Constitutional: Alert, well-appearing, no acute distress Ears: External ears without lesions or tenderness. Ear canals are both obstructed with large amount of cerumen externally  within the ear canal.  This was cleaned with suction and curette.  TMs were clear bilaterally.. Nasal: External nose without lesions. Clear nasal passages Oral: Oropharynx clear. Neck: No palpable adenopathy or masses Respiratory: Breathing comfortably  Skin: No facial/neck lesions or rash noted.  Cerumen impaction removal  Date/Time: 12/14/2020 2:23 PM Performed by: Drema Halon, MD Authorized by: Drema Halon, MD   Consent:    Consent obtained:  Verbal   Consent given by:  Patient   Risks discussed:  Pain and bleeding Procedure details:    Location:  L ear and R ear   Procedure type: curette and suction   Post-procedure details:    Inspection:  TM intact and canal normal   Hearing quality:  Improved   Patient tolerance of procedure:  Tolerated well, no immediate  complications Comments:     TMs are clear bilaterally.    Assessment: Bilateral cerumen impactions  Plan: She will follow-up as needed  Narda Bonds, MD

## 2020-12-28 ENCOUNTER — Ambulatory Visit (INDEPENDENT_AMBULATORY_CARE_PROVIDER_SITE_OTHER): Payer: Medicare Other | Admitting: Otolaryngology

## 2021-02-18 ENCOUNTER — Ambulatory Visit (INDEPENDENT_AMBULATORY_CARE_PROVIDER_SITE_OTHER): Payer: Medicare Other | Admitting: Otolaryngology

## 2021-02-18 ENCOUNTER — Other Ambulatory Visit: Payer: Self-pay

## 2021-02-18 ENCOUNTER — Encounter (INDEPENDENT_AMBULATORY_CARE_PROVIDER_SITE_OTHER): Payer: Self-pay | Admitting: Otolaryngology

## 2021-02-18 VITALS — Temp 97.7°F

## 2021-02-18 DIAGNOSIS — M26629 Arthralgia of temporomandibular joint, unspecified side: Secondary | ICD-10-CM | POA: Diagnosis not present

## 2021-02-18 NOTE — Progress Notes (Signed)
HPI: Denise [name] is a 70 y.o. female who returns today for evaluation of pain in front of the left ear that radiates up into the head.  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  She 1 to make sure she did not have an ear infection or sinus infection.  She is having no congestion on the left side presently no mucopurulent discharge from the left side of her nose.  Her hearing seems to be doing well..  Past Medical History:  Diagnosis Date  . Irregular heart beat   . Kidney stone   . Osteoporosis 07/2019   T score -3.5 overall stable from prior DEXA  . Thyroid disease    Past Surgical History:  Procedure Laterality Date  . DILATION AND CURETTAGE OF UTERUS    . EYE SURGERY    . HYSTEROSCOPY     Social History   Socioeconomic History  . Marital status: Married    Spouse name: Not on file  . Number of children: Not on file  . Years of education: Not on file  . Highest education level: Not on file  Occupational History  . Not on file  Tobacco Use  . Smoking status: Never Smoker  . Smokeless tobacco: Never Used  Vaping Use  . Vaping Use: Never used  Substance and Sexual Activity  . Alcohol use: Never    Alcohol/week: 0.0 standard drinks  . Drug use: No  . Sexual activity: Yes    Birth control/protection: Post-menopausal    Comment: 1st intercourse 83 yo-1 partner  Other Topics Concern  . Not on file  Social History Narrative  . Not on file   Social Determinants of Health   Financial Resource Strain: Not on file  Food Insecurity: Not on file  Transportation Needs: Not on file  Physical Activity: Not on file  Stress: Not on file  Social Connections: Not on file   Family History  Problem Relation Age of Onset  . Dementia Mother   . Heart attack Father   . Cancer Sister        Cervical  . Lung cancer Sister   . Heart disease Brother   . Breast cancer Maternal Aunt 65  . Cancer Brother        Lung cancer  . Cancer Sister        Cervical   Allergies   Allergen Reactions  . Valium [Diazepam]     Hyperventilate  . Codeine Itching   Prior to Admission medications   Medication Sig Start Date End Date Taking? Authorizing Provider  alendronate (FOSAMAX) 70 MG tablet Take 1 tablet (70 mg total) by mouth every 7 (seven) days. Take with a full glass of water on an empty stomach. 08/07/19   Fontaine, Nadyne Coombes, MD  atenolol (TENORMIN) 50 MG tablet Take 50 mg by mouth daily. Take 1/2 pill    [provider]  Cholecalciferol (VITAMIN D PO) Take by mouth.    [provider]  Dexlansoprazole (DEXILANT) 30 MG capsule Take 30 mg by mouth daily.    [provider]  ibuprofen (ADVIL) 800 MG tablet Take 1 tablet (800 mg total) by mouth 3 (three) times daily with meals as needed for moderate pain. 03/27/20   Lorelee New, PA-C  levothyroxine (SYNTHROID, LEVOTHROID) 125 MCG tablet Take 125 mcg by mouth daily before breakfast. Take 1/2 pill    [provider]     Positive ROS: Otherwise negative  All other  systems have been reviewed and were otherwise negative with the exception of those mentioned in the HPI and as above.  Physical Exam: Constitutional: Alert, well-appearing, no acute distress Ears: External ears without lesions or tenderness.  No ear canal inflammatory changes noted.  TMs were clear bilaterally.  Subjectively she heard the tuning fork about the same in both ears with the 1024 tuning fork. Nasal: External nose without lesions. Septum midline.. Clear nasal passages bilaterally.  Both middle meatus regions were clear with no signs of sinus infection. Oral: Lips and gums without lesions. Tongue and palate mucosa without lesions. Posterior oropharynx clear. Neck: No palpable adenopathy or masses.  Palpation of the TMJ joints she had obvious pain in the left TMJ joint. Respiratory: Breathing comfortably  Skin: No facial/neck lesions or rash noted.  Procedures  Assessment: TMJ dysfunction.   Reassured  Plan: Reassured her of no evidence of ear infection or sinus infection and that the pain seems to be coming more from her TMJ joint. Recommended soft diet and stop chewing gum as she likes to chew gum.  Also suggested taking NSAIDs such as Advil as needed discomfort.  Recommended follow-up with her dentist concerning further treatment of her TMJ dysfunction.   Narda Bonds, MD

## 2021-03-09 ENCOUNTER — Other Ambulatory Visit: Payer: Self-pay | Admitting: Internal Medicine

## 2021-03-09 DIAGNOSIS — R519 Headache, unspecified: Secondary | ICD-10-CM

## 2021-04-07 ENCOUNTER — Other Ambulatory Visit: Payer: Self-pay

## 2021-04-07 ENCOUNTER — Ambulatory Visit
Admission: RE | Admit: 2021-04-07 | Discharge: 2021-04-07 | Disposition: A | Discharge status: Home / Self Care | Payer: Medicare Other | Source: Ambulatory Visit | Attending: Internal Medicine | Admitting: Internal Medicine

## 2021-04-07 DIAGNOSIS — R519 Headache, unspecified: Secondary | ICD-10-CM

## 2021-12-27 ENCOUNTER — Encounter: Payer: Self-pay | Admitting: Obstetrics & Gynecology

## 2022-01-14 IMAGING — CT CT MAXILLOFACIAL W/O CM
1 series · 16 of 30 positions shown, 20 images · non-contrast
Comparison: None.

CLINICAL DATA: Left-sided headache. Symptoms for a month.
Congestion.

EXAM:
CT MAXILLOFACIAL WITHOUT CONTRAST
TECHNIQUE: Multidetector CT imaging of the maxillofacial structures was
performed. Multiplanar CT image reconstructions were also generated.

[Series 4: soft tissue · axial · 0.39mm/px · z∈[-128,-24]mm · 16 of 112 slices shown, 20 images]
[im 4/112  brain]
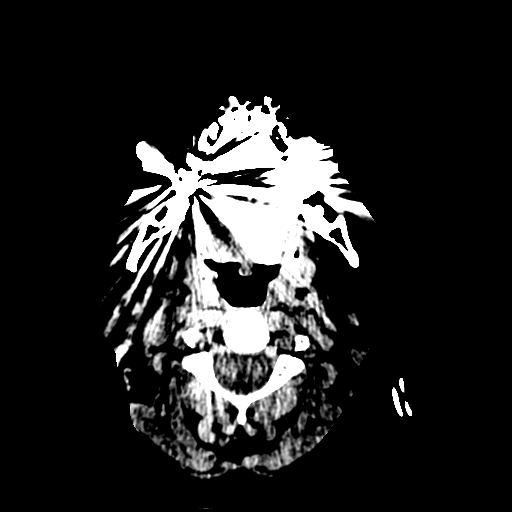
[im 4/112  bone]
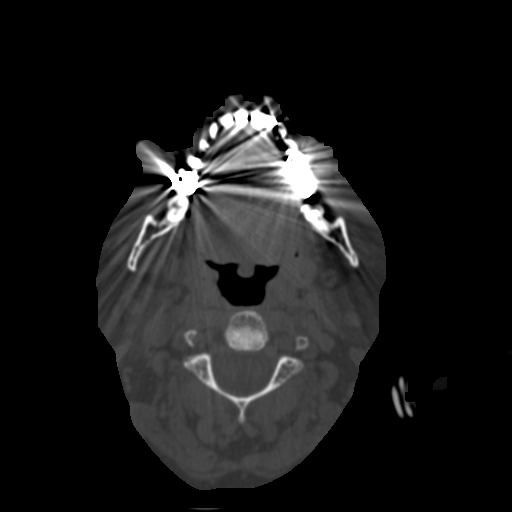
[im 12/112  bone]
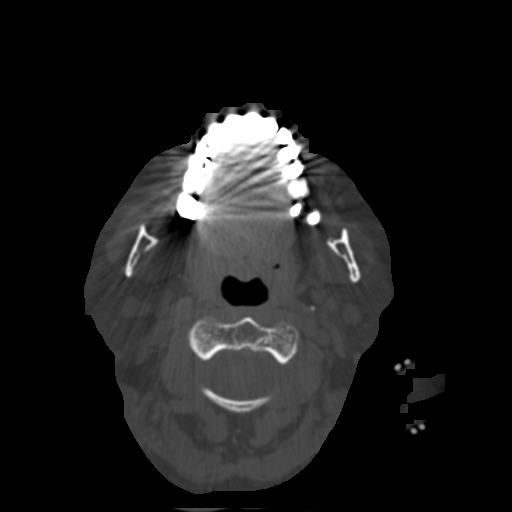
[im 20/112  bone]
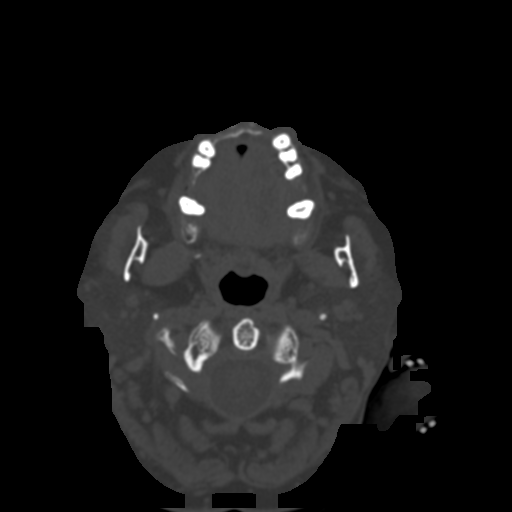
[im 27/112  bone]
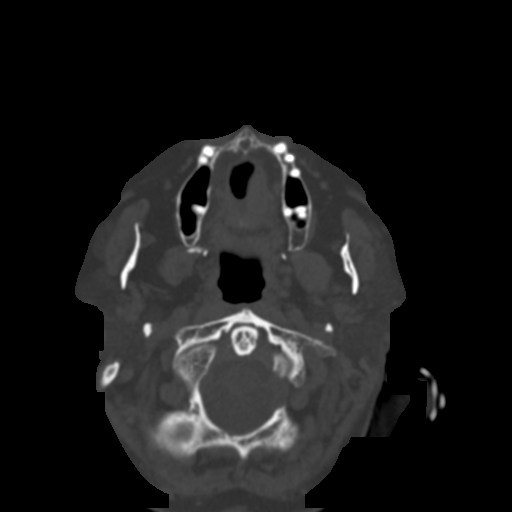
[im 31/112  brain]
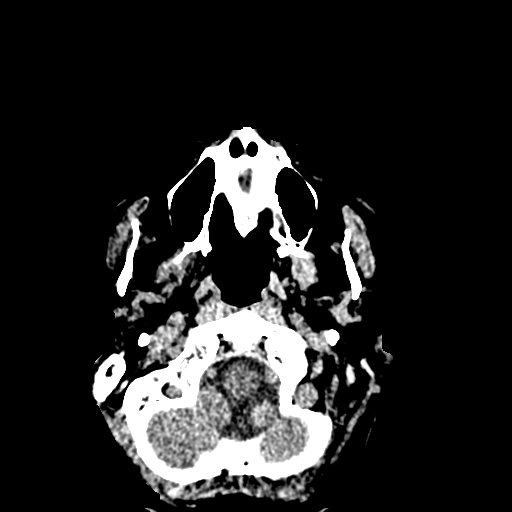
[im 31/112  bone]
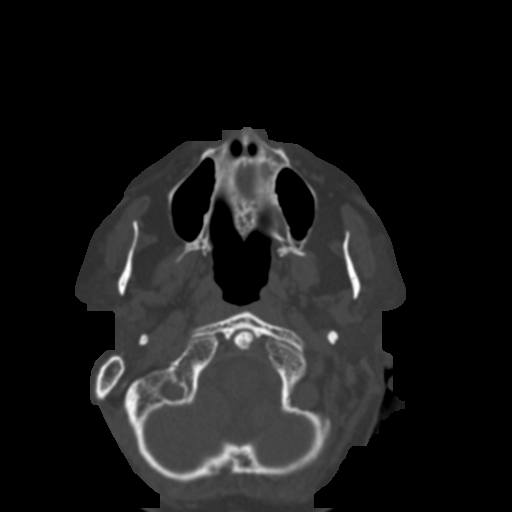
[im 39/112  bone]
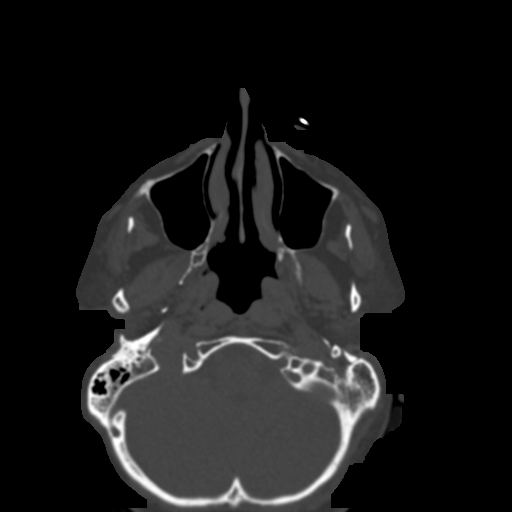
[im 46/112  bone]
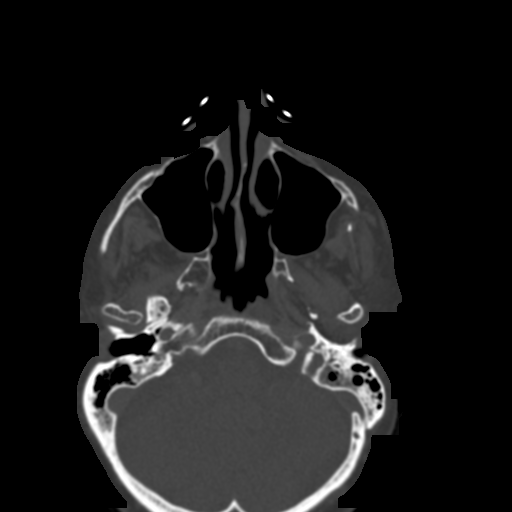
[im 54/112  bone]
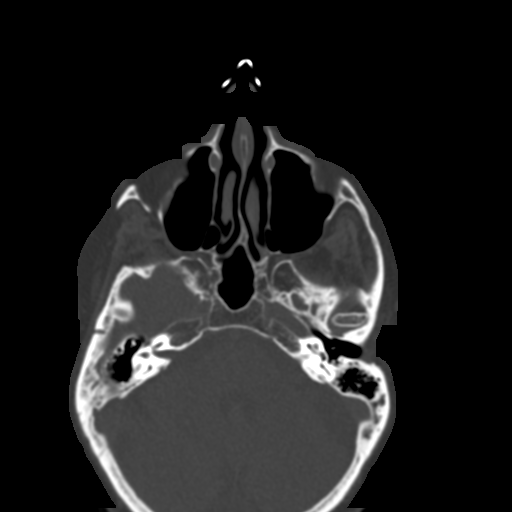
[im 58/112  brain]
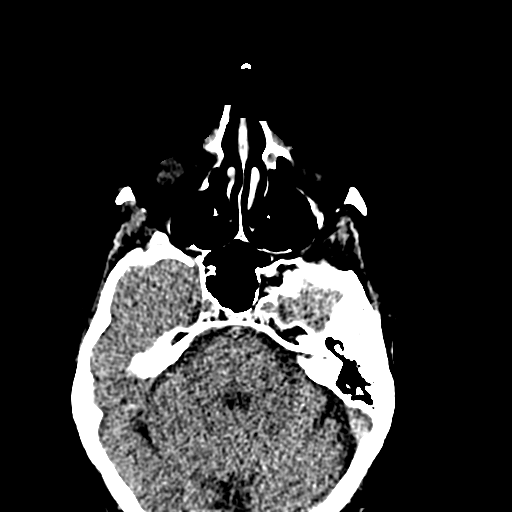
[im 58/112  bone]
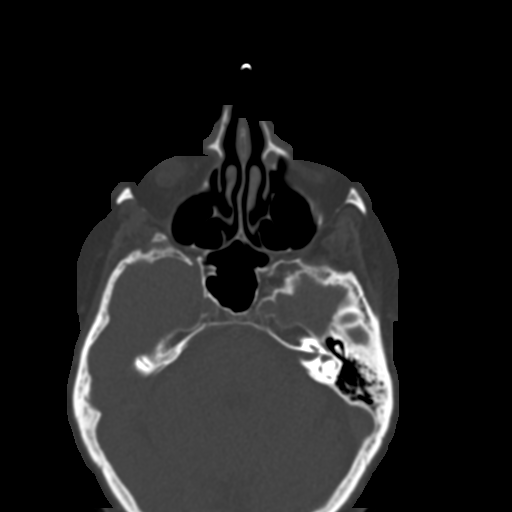
[im 66/112  bone]
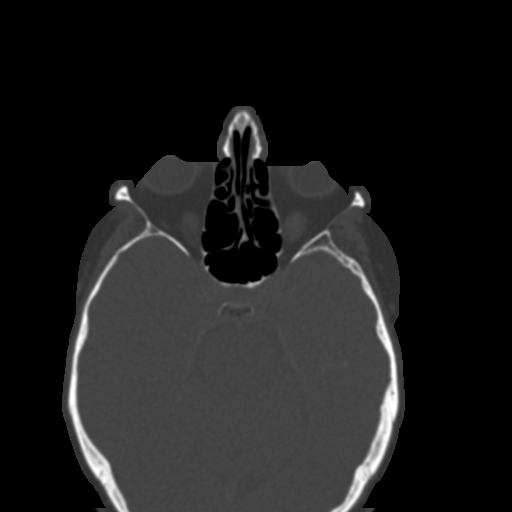
[im 73/112  bone]
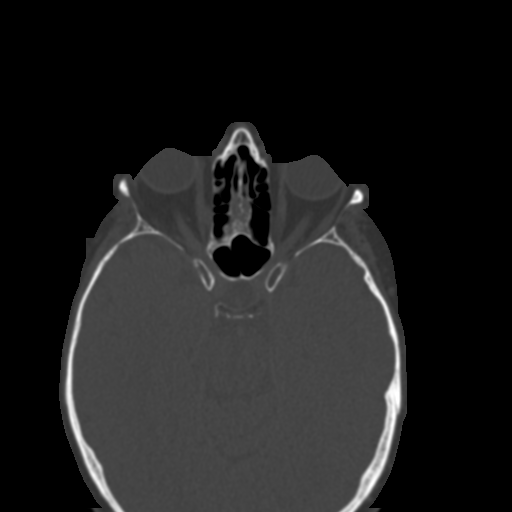
[im 81/112  bone]
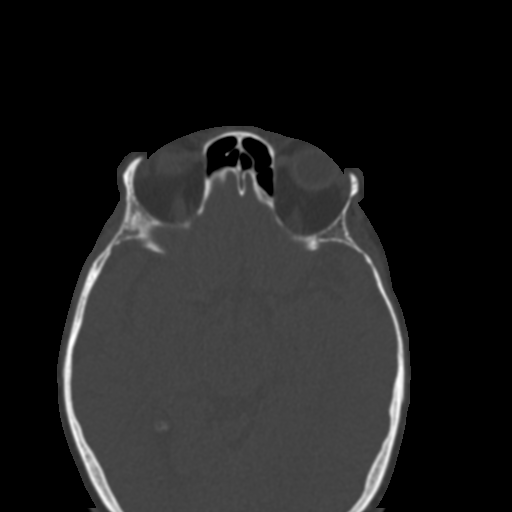
[im 85/112  brain]
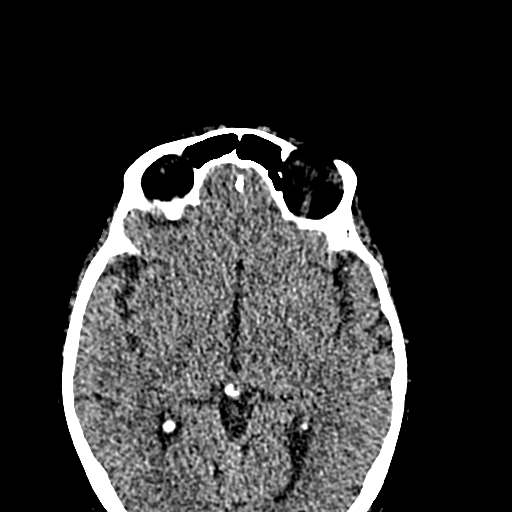
[im 85/112  bone]
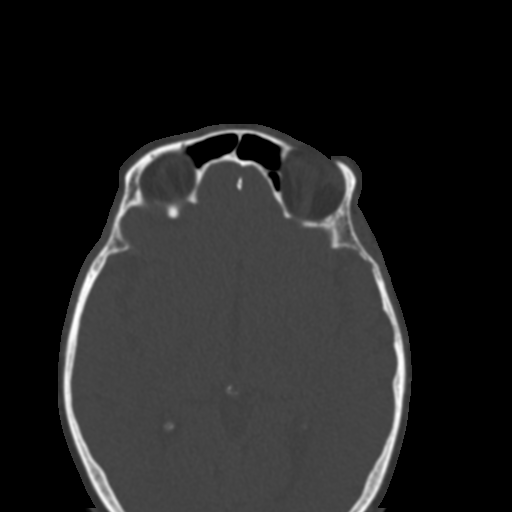
[im 92/112  bone]
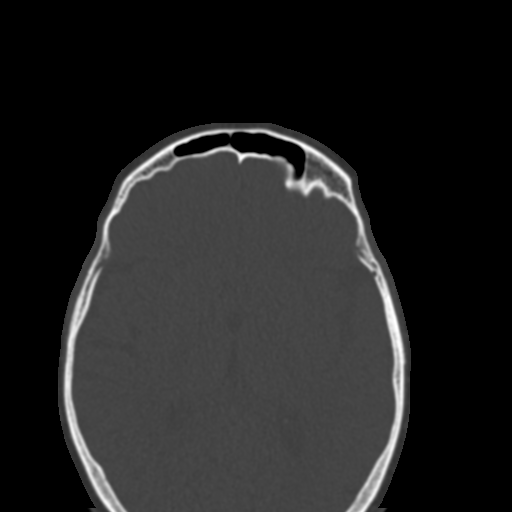
[im 100/112  bone]
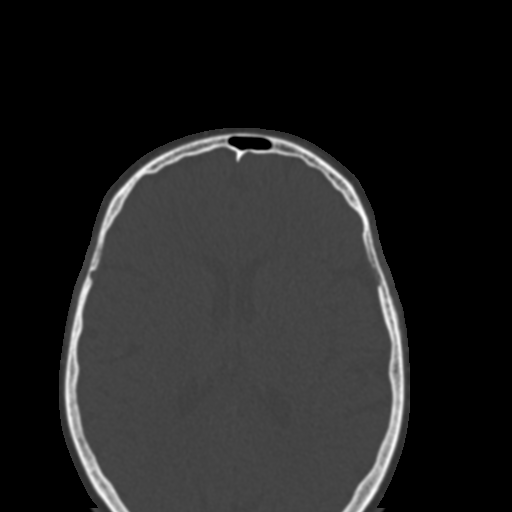
[im 108/112  bone]
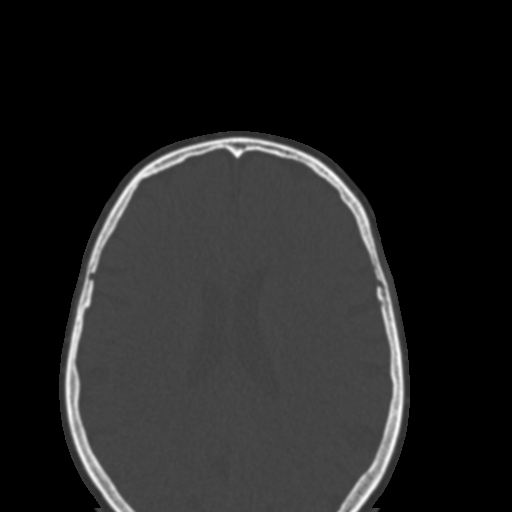

[16 of 30 positions shown; findings below may reference images not displayed]

FINDINGS: Osseous: No fracture or mandibular dislocation. No destructive
process.

Orbits: Negative. No traumatic or inflammatory finding.

Sinuses and mastoid air cells: Clear.

Soft tissues: Negative.

Limited intracranial: No significant or unexpected finding.
IMPRESSION: Unremarkable CT max face.

## 2023-03-14 NOTE — Progress Notes (Signed)
72 y.o. G90P2002 Married Caucasian female here for breast and pelvic exam.  Former patient of Dr. Delilah Shan.   Has osteoporosis of spine and and hips.  Off Fosamax.   Used Fosamax for 4 years, stopped for 8 years, and then restarted in 2020.  She had concerns about atypical fractures and stopped the medication.   States she fractured a hip when she twisted to lift up a box.  She fractured this in the past as well.   Still working.  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   PCP:   Lorene Dy, MD  No LMP recorded. Patient is postmenopausal.           Sexually active: Yes.    The current method of family planning is post menopausal status.    Exercising: Yes.       Smoker:  no  Health Maintenance: Pap:  05/23/18 neg, 03/22/16 neg History of abnormal Pap:  unsure, thinks no MMG:  12/22/21 - Breast Density Category B, BI-RADS CAT 2 benign.  Has appointment at [facility] next week.  Colonoscopy:  2011 per pt, normal.  She knows she is due.  BMD:   07/30/19 - Result  osteoporosis of spine and hips.  TDaP:  07/28/15 Gardasil:   no HIV: n/a Hep C: n/a Screening Labs: PCP   reports that she has never smoked. She has never used smokeless tobacco. She reports that she does not drink alcohol and does not use drugs.  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     atenolol (TENORMIN) 50 MG tablet Take 50 mg by mouth daily. Take 1/2 pill     Cholecalciferol (VITAMIN D PO) Take by mouth.     ibuprofen (ADVIL) 800 MG tablet Take 1 tablet (800 mg total) by mouth 3  (three) times daily with meals as needed for moderate pain. 21 tablet 0   levothyroxine (SYNTHROID, LEVOTHROID) 125 MCG tablet Take 125 mcg by mouth daily before breakfast. Take 1/2 pill     omeprazole (PRILOSEC) 20 MG capsule Take 20 mg by mouth daily.     triamcinolone cream (KENALOG) 0.1 % Apply topically daily.     alendronate (FOSAMAX) 70 MG tablet Take 1 tablet (70 mg total) by mouth every 7 (seven) days. Take with a full glass of water on an empty stomach. (Patient not taking: Reported on 03/28/2023) 4 tablet 11   Dexlansoprazole (DEXILANT) 30 MG capsule Take 30 mg by mouth daily. (Patient not taking: Reported on 03/28/2023)     No current facility-administered medications for this visit.    Family History  Problem Relation Age of Onset   Dementia Mother    Heart attack Father    Cancer Sister        Cervical   Lung cancer Sister    Heart disease Brother    Breast cancer Maternal Aunt 94   Cancer Brother  Lung cancer   Cancer Sister        Cervical    Review of Systems  All other systems reviewed and are negative.   Exam:   BP 124/82 (BP Location: Right Arm, Patient Position: Sitting, Cuff Size: Normal)   Pulse 67   Ht 4' 10.5" (1.486 m)   Wt 122 lb (55.3 kg)   SpO2 98%   BMI 25.06 kg/m     General appearance: alert, cooperative and appears stated age Head: normocephalic, without obvious abnormality, atraumatic Neck: no adenopathy, supple, symmetrical, trachea midline and thyroid normal to inspection and palpation Lungs: clear to auscultation bilaterally Breasts: normal appearance, no masses or tenderness, No nipple retraction or dimpling, No nipple discharge or bleeding, No axillary adenopathy Heart: regular rate and rhythm Abdomen: soft, non-tender; no masses, no organomegaly Extremities: extremities normal, atraumatic, no cyanosis or edema Skin: skin color, texture, turgor normal. No rashes or lesions Lymph nodes: cervical, supraclavicular, and axillary nodes  normal. Neurologic: grossly normal  Pelvic: External genitalia:  no lesions              No abnormal inguinal nodes palpated.              Urethra:  normal appearing urethra with no masses, tenderness or lesions              Bartholins and Skenes: normal                 Vagina: normal appearing vagina with normal color and discharge, no lesions              Cervix: no lesions              Pap taken: yes Bimanual Exam:  Uterus:  normal size, contour, position, consistency, mobility, non-tender              Adnexa: no mass, fullness, tenderness              Rectal exam: yes.  Confirms.              Anus:  normal sphincter tone, no lesions  Chaperone was present for exam:  Oneal Deputy, CMA  Assessment:   Well woman visit with gynecologic exam. Osteoporosis.  Currently untreated. Hx rib fractures.  Cervical cancer screening.   Plan: Mammogram screening discussed. Self breast awareness reviewed. Pap and HR HPV as above. Guidelines for Calcium, Vitamin D, regular exercise program including cardiovascular and weight bearing exercise. Bone health and BMD reviewed.  Return for bone density study. We discussed possible alternatives to Fosamax - Prolia. Follow up annually and prn.   After visit summary provided.   30 min  total time was spent for this patient encounter, including preparation, face-to-face counseling with the patient, coordination of care, and documentation of the encounter in addition to doing breast and pelvic exam.

## 2023-03-28 ENCOUNTER — Other Ambulatory Visit (HOSPITAL_COMMUNITY)
Admission: RE | Admit: 2023-03-28 | Discharge: 2023-03-28 | Disposition: A | Discharge status: Home / Self Care | Payer: Medicare Other | Source: Ambulatory Visit | Attending: Obstetrics and Gynecology | Admitting: Obstetrics and Gynecology

## 2023-03-28 ENCOUNTER — Ambulatory Visit (INDEPENDENT_AMBULATORY_CARE_PROVIDER_SITE_OTHER): Payer: Medicare Other | Admitting: Obstetrics and Gynecology

## 2023-03-28 ENCOUNTER — Encounter: Payer: Self-pay | Admitting: Obstetrics and Gynecology

## 2023-03-28 VITALS — BP 124/82 | HR 67 | Ht 58.5 in | Wt 122.0 lb

## 2023-03-28 DIAGNOSIS — Z1151 Encounter for screening for human papillomavirus (HPV): Secondary | ICD-10-CM | POA: Insufficient documentation

## 2023-03-28 DIAGNOSIS — Z01419 Encounter for gynecological examination (general) (routine) without abnormal findings: Secondary | ICD-10-CM

## 2023-03-28 DIAGNOSIS — M810 Age-related osteoporosis without current pathological fracture: Secondary | ICD-10-CM

## 2023-03-28 DIAGNOSIS — Z124 Encounter for screening for malignant neoplasm of cervix: Secondary | ICD-10-CM

## 2023-03-28 NOTE — Patient Instructions (Addendum)
EXERCISE AND DIET:  We recommended that you start or continue a regular exercise program for good health. Regular exercise means any activity that makes your heart beat faster and makes you sweat.  We recommend exercising at least 30 minutes per day at least 3 days a week, preferably 4 or 5.  We also recommend a diet low in fat and sugar.  Inactivity, poor dietary choices and obesity can cause diabetes, heart attack, stroke, and kidney damage, among others.    ALCOHOL AND SMOKING:  Women should limit their alcohol intake to no more than 7 drinks/beers/glasses of wine (combined, not each!) per week. Moderation of alcohol intake to this level decreases your risk of breast cancer and liver damage. And of course, no recreational drugs are part of a healthy lifestyle.  And absolutely no smoking or even second hand smoke. Most people know smoking can cause heart and lung diseases, but did you know it also contributes to weakening of your bones? Aging of your skin?  Yellowing of your teeth and nails?  CALCIUM AND VITAMIN D:  Adequate intake of calcium and Vitamin D are recommended.  The recommendations for exact amounts of these supplements seem to change often, but generally speaking 600 mg of calcium (either carbonate or citrate) and 800 units of Vitamin D per day seems prudent. Certain women may benefit from higher intake of Vitamin D.  If you are among these women, your doctor will have told you during your visit.    PAP SMEARS:  Pap smears, to check for cervical cancer or precancers,  have traditionally been done yearly, although recent scientific advances have shown that most women can have pap smears less often.  However, every woman still should have a physical exam from her gynecologist every year. It will include a breast check, inspection of the vulva and vagina to check for abnormal growths or skin changes, a visual exam of the cervix, and then an exam to evaluate the size and shape of the uterus and  ovaries.  And after 70 years of age, a rectal exam is indicated to check for rectal cancers. We will also provide age appropriate advice regarding health maintenance, like when you should have certain vaccines, screening for sexually transmitted diseases, bone density testing, colonoscopy, mammograms, etc.   MAMMOGRAMS:  All women over 40 years old should have a yearly mammogram. Many facilities now offer a "3D" mammogram, which may cost around $50 extra out of pocket. If possible,  we recommend you accept the option to have the 3D mammogram performed.  It both reduces the number of women who will be called back for extra views which then turn out to be normal, and it is better than the routine mammogram at detecting truly abnormal areas.    COLONOSCOPY:  Colonoscopy to screen for colon cancer is recommended for all women at age 50.  We know, you hate the idea of the prep.  We agree, BUT, having colon cancer and not knowing it is worse!!  Colon cancer so often starts as a polyp that can be seen and removed at colonscopy, which can quite literally save your life!  And if your first colonoscopy is normal and you have no family history of colon cancer, most women don't have to have it again for 10 years.  Once every ten years, you can do something that may end up saving your life, right?  We will be happy to help you get it scheduled when you are ready.    Be sure to check your insurance coverage so you understand how much it will cost.  It may be covered as a preventative service at no cost, but you should check your particular policy.    Calcium Content in Foods Calcium is the most abundant mineral in the body. Most of the body's calcium supply is stored in bones and teeth. Calcium helps many parts of the body function normally, including: Blood and blood vessels. Nerves. Hormones. Muscles. Bones and teeth. When your calcium stores are low, you may be at risk for low bone mass, bone loss, and broken bones  (fractures). When you get enough calcium, it helps to support strong bones and teeth throughout your life. Calcium is especially important for: Children during growth spurts. Girls during adolescence. Women who are pregnant or breastfeeding. Women after their menstrual cycle stops (postmenopause). Women whose menstrual cycle has stopped due to anorexia nervosa or regular intense exercise. People who cannot eat or digest dairy products. Vegans. Recommended daily amounts of calcium: Women (ages 19 to 50): 1,000 mg per day. Women (ages 51 and older): 1,200 mg per day. Men (ages 19 to 70): 1,000 mg per day. Men (ages 71 and older): 1,200 mg per day. Women (ages 9 to 18): 1,300 mg per day. Men (ages 9 to 18): 1,300 mg per day. General information Eat foods that are high in calcium. Try to get most of your calcium from food. Some people may benefit from taking calcium supplements. Check with your health care provider or diet and nutrition specialist (dietitian) before starting any calcium supplements. Calcium supplements may interact with certain medicines. Too much calcium may cause other health problems, such as constipation and kidney stones. For the body to absorb calcium, it needs vitamin D. Sources of vitamin D include: Skin exposure to direct sunlight. Foods, such as egg yolks, liver, mushrooms, saltwater fish, and fortified milk. Vitamin D supplements. Check with your health care provider or dietitian before starting any vitamin D supplements. What foods are high in calcium?  Foods that are high in calcium contain more than 100 milligrams per serving. Fruits Fortified orange juice or other fruit juice, 300 mg per 8 oz serving. Vegetables Collard greens, 360 mg per 8 oz serving. Kale, 100 mg per 8 oz serving. Bok choy, 160 mg per 8 oz serving. Grains Fortified ready-to-eat cereals, 100 to 1,000 mg per 8 oz serving. Fortified frozen waffles, 200 mg in 2 waffles. Oatmeal, 140 mg in  1 cup. Meats and other proteins Sardines, canned with bones, 325 mg per 3 oz serving. Salmon, canned with bones, 180 mg per 3 oz serving. Canned shrimp, 125 mg per 3 oz serving. Baked beans, 160 mg per 4 oz serving. Tofu, firm, made with calcium sulfate, 253 mg per 4 oz serving. Dairy Yogurt, plain, low-fat, 310 mg per 6 oz serving. Nonfat milk, 300 mg per 8 oz serving. American cheese, 195 mg per 1 oz serving. Cheddar cheese, 205 mg per 1 oz serving. Cottage cheese 2%, 105 mg per 4 oz serving. Fortified soy, rice, or almond milk, 300 mg per 8 oz serving. Mozzarella, part skim, 210 mg per 1 oz serving. The items listed above may not be a complete list of foods high in calcium. Actual amounts of calcium may be different depending on processing. Contact a dietitian for more information. What foods are lower in calcium? Foods that are lower in calcium contain 50 mg or less per serving. Fruits Apple, about 6 mg. Banana, about 12 mg.   Vegetables Lettuce, 19 mg per 2 oz serving. Tomato, about 11 mg. Grains Rice, 4 mg per 6 oz serving. Boiled potatoes, 14 mg per 8 oz serving. White bread, 6 mg per slice. Meats and other proteins Egg, 27 mg per 2 oz serving. Red meat, 7 mg per 4 oz serving. Chicken, 17 mg per 4 oz serving. Fish, cod, or trout, 20 mg per 4 oz serving. Dairy Cream cheese, regular, 14 mg per 1 Tbsp serving. Brie cheese, 50 mg per 1 oz serving. Parmesan cheese, 70 mg per 1 Tbsp serving. The items listed above may not be a complete list of foods lower in calcium. Actual amounts of calcium may be different depending on processing. Contact a dietitian for more information. Summary Calcium is an important mineral in the body because it affects many functions. Getting enough calcium helps support strong bones and teeth throughout your life. Try to get most of your calcium from food. Calcium supplements may interact with certain medicines. Check with your health care provider  or dietitian before starting any calcium supplements. This information is not intended to replace advice given to you by your health care provider. Make sure you discuss any questions you have with your health care provider. Document Revised: 04/07/2020 Document Reviewed: 04/07/2020 [employer] Patient Education  2023 [employer]  

## 2023-03-29 ENCOUNTER — Other Ambulatory Visit: Payer: Self-pay

## 2023-03-29 DIAGNOSIS — M810 Age-related osteoporosis without current pathological fracture: Secondary | ICD-10-CM

## 2023-03-29 LAB — CYTOLOGY - PAP
Comment: NEGATIVE
Diagnosis: NEGATIVE
High risk HPV: NEGATIVE

## 2023-07-10 ENCOUNTER — Other Ambulatory Visit: Payer: Self-pay | Admitting: Obstetrics and Gynecology

## 2023-07-10 ENCOUNTER — Ambulatory Visit (INDEPENDENT_AMBULATORY_CARE_PROVIDER_SITE_OTHER): Payer: Medicare Other

## 2023-07-10 DIAGNOSIS — Z780 Asymptomatic menopausal state: Secondary | ICD-10-CM

## 2023-07-10 DIAGNOSIS — M810 Age-related osteoporosis without current pathological fracture: Secondary | ICD-10-CM

## 2023-07-10 DIAGNOSIS — Z13820 Encounter for screening for osteoporosis: Secondary | ICD-10-CM | POA: Diagnosis not present

## 2023-07-24 NOTE — Progress Notes (Unsigned)
GYNECOLOGY  VISIT   HPI: 71 y.o.   Married  Caucasian  female   G2P2002 with No LMP recorded. Patient is postmenopausal.   here for   discuss dexa.  Hx osteoporosis of spine and hips.   Hx Fosamax use.  Used for 4 years, stopped for 8 years, restarted 2017 and took for one year.   She was concerned about atypical fractures and stopped Fosamax.  She has no side effects.  She has a history of hip fracture and rib fracture.   Her BMD shows T score of spine -3.7.  Has GERD.  Normal TFTs 07/26/23.   GYNECOLOGIC HISTORY: No LMP recorded. Patient is postmenopausal. Contraception:  PMP Menopausal hormone therapy:  n/a Last mammogram: April 2024 per pt, 12/27/21 - Breast Density Category B, BI-RADS CAT 2 benign.  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     atenolol (TENORMIN) 50 MG tablet Take 50 mg by mouth daily. Take 1/2 pill     Cholecalciferol (VITAMIN D PO) Take by mouth.     ibuprofen (ADVIL) 800 MG tablet Take 1 tablet (800 mg total) by mouth 3 (three) times daily with meals as needed for moderate pain. 21  tablet 0   levothyroxine (SYNTHROID, LEVOTHROID) 125 MCG tablet Take 125 mcg by mouth daily before breakfast. Take 1/2 pill     omeprazole (PRILOSEC) 20 MG capsule Take 20 mg by mouth daily.     triamcinolone cream (KENALOG) 0.1 % Apply topically daily.     alendronate (FOSAMAX) 70 MG tablet Take 1 tablet (70 mg total) by mouth every 7 (seven) days. Take with a full glass of water on an empty stomach. (Patient not taking: Reported on 03/28/2023) 4 tablet 11   No current facility-administered medications for this visit.     ALLERGIES: Valium [diazepam] and Codeine  Family History  Problem Relation Age of Onset   Dementia Mother    Heart attack Father    Cancer Sister        Cervical   Lung cancer Sister    Heart disease Brother    Breast cancer Maternal Aunt 27  Cancer Brother        Lung cancer   Cancer Sister        Cervical    Social History   Socioeconomic History   Marital status: Married    Spouse name: Not on file   Number of children: Not on file   Years of education: Not on file   Highest education level: Not on file  Occupational History   Not on file  Tobacco Use   Smoking status: Never   Smokeless tobacco: Never  Vaping Use   Vaping status: Never Used  Substance and Sexual Activity   Alcohol use: Never    Alcohol/week: 0.0 standard drinks of alcohol   Drug use: No   Sexual activity: Yes    Birth control/protection: Post-menopausal    Comment: 1st intercourse 42 yo-1 partner  Other Topics Concern   Not on file  Social History Narrative   Not on file   Social Determinants of Health   Financial Resource Strain: Not on file  Food Insecurity: Not on file  Transportation Needs: Not on file  Physical Activity: Not on file  Stress: Not on file  Social Connections: Not on file  Intimate Partner Violence: Not on file    Review of Systems  All other systems reviewed and are negative.   PHYSICAL EXAMINATION:    BP 116/72 (BP Location: Left Arm,  Patient Position: Sitting, Cuff Size: Normal)   Pulse (!) 50   Ht 4' 10.5" (1.486 m)   Wt 121 lb (54.9 kg)   SpO2 99%   BMI 24.86 kg/m     General appearance: alert, cooperative and appears stated age Head: Normocephalic, without obvious abnormality, atraumatic Neck: no adenopathy, supple, symmetrical, trachea midline and thyroid normal to inspection and palpation Lungs: clear to auscultation bilaterally Breasts: normal appearance, no masses or tenderness, No nipple retraction or dimpling, No nipple discharge or bleeding, No axillary or supraclavicular adenopathy Heart: regular rate and rhythm Abdomen: soft, non-tender, no masses,  no organomegaly Extremities: extremities normal, atraumatic, no cyanosis or edema Skin: Skin color, texture, turgor normal. No rashes or lesions Lymph nodes: Cervical, supraclavicular, and axillary nodes normal. No abnormal inguinal nodes palpated Neurologic: Grossly normal  Pelvic: External genitalia:  no lesions              Urethra:  normal appearing urethra with no masses, tenderness or lesions              Bartholins and Skenes: normal                 Vagina: normal appearing vagina with normal color and discharge, no lesions              Cervix: no lesions                Bimanual Exam:  Uterus:  normal size, contour, position, consistency, mobility, non-tender              Adnexa: no mass, fullness, tenderness              Rectal exam: {yes no:[id number]}.  Confirms.              Anus:  normal sphincter tone, no lesions  Chaperone was present for exam:  ***  ASSESSMENT     PLAN  Labs.  Fosamax 70 mg weekly. Calcium and vit D discussed.    An After Visit Summary was printed and given to the patient.  ______ minutes face to  face time of which over 50% was spent in counseling.

## 2023-08-07 ENCOUNTER — Encounter: Payer: Self-pay | Admitting: Obstetrics and Gynecology

## 2023-08-07 ENCOUNTER — Ambulatory Visit: Payer: Medicare Other | Admitting: Obstetrics and Gynecology

## 2023-08-07 VITALS — BP 116/72 | HR 50 | Ht 58.5 in | Wt 121.0 lb

## 2023-08-07 DIAGNOSIS — M810 Age-related osteoporosis without current pathological fracture: Secondary | ICD-10-CM

## 2023-08-09 ENCOUNTER — Other Ambulatory Visit: Payer: Self-pay | Admitting: Obstetrics and Gynecology

## 2023-08-09 ENCOUNTER — Encounter: Payer: Self-pay | Admitting: Obstetrics and Gynecology

## 2023-08-09 DIAGNOSIS — R7989 Other specified abnormal findings of blood chemistry: Secondary | ICD-10-CM

## 2023-08-09 MED ORDER — ALENDRONATE SODIUM 70 MG PO TABS: 70.0000 mg | ORAL_TABLET | ORAL | 3 refills | Status: AC

## 2023-08-21 ENCOUNTER — Encounter: Payer: Self-pay | Admitting: Obstetrics and Gynecology

## 2023-12-10 ENCOUNTER — Telehealth: Payer: Self-pay | Admitting: Obstetrics and Gynecology

## 2023-12-10 NOTE — Telephone Encounter (Signed)
Please contact patient to schedule a follow up visit with me regarding her low parathyroid hormone level and her treatment of osteoporosis with her Fosamax.   She was due for an office visit and labs in November.

## 2023-12-11 NOTE — Telephone Encounter (Signed)
LDVM on machine per DPR advising the pt to cb and schedule or let us know if she has any additional clinical questions/concerns.

## 2024-02-06 ENCOUNTER — Telehealth: Payer: Self-pay | Admitting: Obstetrics and Gynecology

## 2024-02-06 NOTE — Telephone Encounter (Signed)
Please contact patient to schedule a recheck appointment with me.   She has a low PTH level and was due for a recheck at the end of the year last year.   She was restarted on Fosamax in August.   She came up in my reminder box in Epic.

## 2024-02-07 NOTE — Telephone Encounter (Signed)
LVMTCB

## 2024-02-14 NOTE — Telephone Encounter (Signed)
 LVMTCB

## 2024-02-22 NOTE — Telephone Encounter (Signed)
 No returned call.  Unable to reach pt. Shall we send letter?

## 2024-02-25 NOTE — Telephone Encounter (Signed)
 I agree with a plan for a letter recommending she return for lab visit for calcium and PTH check.    She had low PTH with prior blood work on 08/07/23, and the follow up was recommended.

## 2024-02-28 NOTE — Telephone Encounter (Signed)
 Letter pended, copy printed and to Dr. Edward Jolly to review to be signed and mailed to address on file.

## 2024-03-03 NOTE — Telephone Encounter (Signed)
 I signed the letter and placed in on Triage desk for mailing.

## 2024-03-03 NOTE — Telephone Encounter (Signed)
 Letter sent via MyChart.   Letter sent by mail to address on file.   Encounter closed.

## 2024-03-05 NOTE — Telephone Encounter (Signed)
 Letter authorized by provider and mailed.

## 2024-03-10 ENCOUNTER — Other Ambulatory Visit: Payer: Self-pay | Admitting: Internal Medicine

## 2024-03-10 DIAGNOSIS — M26612 Adhesions and ankylosis of left temporomandibular joint: Secondary | ICD-10-CM

## 2024-03-18 ENCOUNTER — Ambulatory Visit
Admission: RE | Admit: 2024-03-18 | Discharge: 2024-03-18 | Disposition: A | Discharge status: Home / Self Care | Source: Ambulatory Visit | Attending: Internal Medicine | Admitting: Internal Medicine

## 2024-03-18 ENCOUNTER — Other Ambulatory Visit

## 2024-03-18 DIAGNOSIS — M26612 Adhesions and ankylosis of left temporomandibular joint: Secondary | ICD-10-CM

## 2025-03-23 ENCOUNTER — Encounter: Admitting: Obstetrics and Gynecology
